# Patient Record
Sex: Male | Born: 1970 | Race: White | Hispanic: No | Marital: Single | Smoking: Never smoker
Health system: Southern US, Community
[De-identification: ages and names within clinical notes are randomized; demographics above are authoritative.]

## PROBLEM LIST (undated history)

## (undated) DIAGNOSIS — M1990 Unspecified osteoarthritis, unspecified site: Secondary | ICD-10-CM

## (undated) DIAGNOSIS — G4730 Sleep apnea, unspecified: Secondary | ICD-10-CM

## (undated) DIAGNOSIS — K429 Umbilical hernia without obstruction or gangrene: Secondary | ICD-10-CM

## (undated) DIAGNOSIS — R531 Weakness: Secondary | ICD-10-CM

## (undated) DIAGNOSIS — F329 Major depressive disorder, single episode, unspecified: Secondary | ICD-10-CM

## (undated) DIAGNOSIS — I10 Essential (primary) hypertension: Secondary | ICD-10-CM

## (undated) DIAGNOSIS — F32A Depression, unspecified: Secondary | ICD-10-CM

## (undated) DIAGNOSIS — M549 Dorsalgia, unspecified: Secondary | ICD-10-CM

## (undated) DIAGNOSIS — K760 Fatty (change of) liver, not elsewhere classified: Secondary | ICD-10-CM

## (undated) DIAGNOSIS — G8929 Other chronic pain: Secondary | ICD-10-CM

## (undated) DIAGNOSIS — K219 Gastro-esophageal reflux disease without esophagitis: Secondary | ICD-10-CM

## (undated) DIAGNOSIS — E785 Hyperlipidemia, unspecified: Secondary | ICD-10-CM

## (undated) DIAGNOSIS — M62838 Other muscle spasm: Secondary | ICD-10-CM

## (undated) HISTORY — PX: BACK SURGERY: SHX140

## (undated) HISTORY — DX: Fatty (change of) liver, not elsewhere classified: K76.0

## (undated) HISTORY — PX: APPENDECTOMY: SHX54

---

## 2014-03-09 ENCOUNTER — Other Ambulatory Visit: Payer: Self-pay | Admitting: Neurosurgery

## 2014-03-21 ENCOUNTER — Encounter (HOSPITAL_COMMUNITY): Payer: Self-pay | Admitting: Pharmacy Technician

## 2014-03-23 NOTE — Pre-Procedure Instructions (Signed)
Corey Chen  03/23/2014   Your procedure is scheduled on:  Thurs, Sept 10 @ 10:00 AM  Report to [facility]  at 7:00 AM.  Call this number if you have problems the morning of surgery: [phone]   Remember:   Do not eat food or drink liquids after midnight.   Take these medicines the morning of surgery with A SIP OF WATER: Pain Pill(if needed)               No Goody's,BC's,Aleve,Aspirin,Ibuprofen,Fish Oil,or any Herbal Medications   Do not wear jewelry  Do not wear lotions, powders, or colognes. You may wear deodorant.  Men may shave face and neck.  Do not bring valuables to the hospital.  [facility] is not responsible                  for any belongings or valuables.               Contacts, dentures or bridgework may not be worn into surgery.  Leave suitcase in the car. After surgery it may be brought to your room.  For patients admitted to the hospital, discharge time is determined by your                treatment team.                Special Instructions:  [facility] - Preparing for Surgery  Before surgery, you can play an important role.  Because skin is not sterile, your skin needs to be as free of germs as possible.  You can reduce the number of germs on you skin by washing with CHG (chlorahexidine gluconate) soap before surgery.  CHG is an antiseptic cleaner which kills germs and bonds with the skin to continue killing germs even after washing.  Please DO NOT use if you have an allergy to CHG or antibacterial soaps.  If your skin becomes reddened/irritated stop using the CHG and inform your nurse when you arrive at Short Stay.  Do not shave (including legs and underarms) for at least 48 hours prior to the first CHG shower.  You may shave your face.  Please follow these instructions carefully:   1.  Shower with CHG Soap the night before surgery and the                                morning of Surgery.  2.  If you choose to wash your hair, wash your hair first  as usual with your       normal shampoo.  3.  After you shampoo, rinse your hair and body thoroughly to remove the                      Shampoo.  4.  Use CHG as you would any other liquid soap.  You can apply chg directly       to the skin and wash gently with scrungie or a clean washcloth.  5.  Apply the CHG Soap to your body ONLY FROM THE NECK DOWN.        Do not use on open wounds or open sores.  Avoid contact with your eyes,       ears, mouth and genitals (private parts).  Wash genitals (private parts)       with your normal soap.  6.  Wash thoroughly, paying special attention  to the area where your surgery        will be performed.  7.  Thoroughly rinse your body with warm water from the neck down.  8.  DO NOT shower/wash with your normal soap after using and rinsing off       the CHG Soap.  9.  Pat yourself dry with a clean towel.            10.  Wear clean pajamas.            11.  Place clean sheets on your bed the night of your first shower and do not        sleep with pets.  Day of Surgery  Do not apply any lotions/deoderants the morning of surgery.  Please wear clean clothes to the hospital/surgery center.     Please read over the following fact sheets that you were given: Pain Booklet, Coughing and Deep Breathing, Blood Transfusion Information, MRSA Information and Surgical Site Infection Prevention

## 2014-03-24 ENCOUNTER — Encounter (HOSPITAL_COMMUNITY)
Admission: RE | Admit: 2014-03-24 | Discharge: 2014-03-24 | Disposition: A | Discharge status: Home / Self Care | Payer: Managed Care, Other (non HMO) | Source: Ambulatory Visit | Attending: Anesthesiology | Admitting: Anesthesiology

## 2014-03-24 ENCOUNTER — Encounter (HOSPITAL_COMMUNITY): Payer: Self-pay

## 2014-03-24 ENCOUNTER — Encounter (HOSPITAL_COMMUNITY)
Admission: RE | Admit: 2014-03-24 | Discharge: 2014-03-24 | Disposition: A | Discharge status: Home / Self Care | Payer: Managed Care, Other (non HMO) | Source: Ambulatory Visit | Attending: Neurosurgery | Admitting: Neurosurgery

## 2014-03-24 DIAGNOSIS — M5126 Other intervertebral disc displacement, lumbar region: Secondary | ICD-10-CM | POA: Diagnosis present

## 2014-03-24 DIAGNOSIS — Z01818 Encounter for other preprocedural examination: Secondary | ICD-10-CM | POA: Diagnosis present

## 2014-03-24 DIAGNOSIS — M47817 Spondylosis without myelopathy or radiculopathy, lumbosacral region: Secondary | ICD-10-CM | POA: Diagnosis present

## 2014-03-24 HISTORY — DX: Major depressive disorder, single episode, unspecified: F32.9

## 2014-03-24 HISTORY — DX: Depression, unspecified: F32.A

## 2014-03-24 HISTORY — DX: Dorsalgia, unspecified: M54.9

## 2014-03-24 HISTORY — DX: Other chronic pain: G89.29

## 2014-03-24 HISTORY — DX: Other muscle spasm: M62.838

## 2014-03-24 HISTORY — DX: Weakness: R53.1

## 2014-03-24 HISTORY — DX: Hyperlipidemia, unspecified: E78.5

## 2014-03-24 HISTORY — DX: Gastro-esophageal reflux disease without esophagitis: K21.9

## 2014-03-24 HISTORY — DX: Essential (primary) hypertension: I10

## 2014-03-24 HISTORY — DX: Unspecified osteoarthritis, unspecified site: M19.90

## 2014-03-24 HISTORY — DX: Umbilical hernia without obstruction or gangrene: K42.9

## 2014-03-24 HISTORY — DX: Sleep apnea, unspecified: G47.30

## 2014-03-24 LAB — CBC
HCT: 46.6 % (ref 39.0–52.0)
HEMOGLOBIN: 16.7 g/dL (ref 13.0–17.0)
MCH: 30.1 pg (ref 26.0–34.0)
MCHC: 35.8 g/dL (ref 30.0–36.0)
MCV: 84.1 fL (ref 78.0–100.0)
Platelets: 161 10*3/uL (ref 150–400)
RBC: 5.54 MIL/uL (ref 4.22–5.81)
RDW: 12.8 % (ref 11.5–15.5)
WBC: 5.2 10*3/uL (ref 4.0–10.5)

## 2014-03-24 LAB — SURGICAL PCR SCREEN
MRSA, PCR: NEGATIVE
Staphylococcus aureus: POSITIVE — AB

## 2014-03-24 LAB — BASIC METABOLIC PANEL
Anion gap: 15 (ref 5–15)
BUN: 15 mg/dL (ref 6–23)
CHLORIDE: 103 meq/L (ref 96–112)
CO2: 22 mEq/L (ref 19–32)
Calcium: 9.6 mg/dL (ref 8.4–10.5)
Creatinine, Ser: 0.84 mg/dL (ref 0.50–1.35)
GFR calc Af Amer: >90 mL/min (ref >90)
GFR calc non Af Amer: >90 mL/min (ref >90)
Glucose, Bld: 104 mg/dL — ABNORMAL HIGH (ref 70–99)
POTASSIUM: 4.1 meq/L (ref 3.7–5.3)
SODIUM: 140 meq/L (ref 137–147)

## 2014-03-24 LAB — TYPE AND SCREEN
ABO/RH(D): B POS
ANTIBODY SCREEN: NEGATIVE

## 2014-03-24 LAB — ABO/RH: ABO/RH(D): B POS

## 2014-03-24 NOTE — Progress Notes (Signed)
Pt doesn't have a cardiologist  Stress test done 5-19yrs ago  Echo done about 5-6 yrs ago  Denies ever having a heart cath  Denies EKG or CXR in past yr  Medical Md is Dr.David Hungerlan

## 2014-03-24 NOTE — Progress Notes (Signed)
Mupirocin called into [facility] on [address] in [place]

## 2014-03-30 MED ORDER — CEFAZOLIN SODIUM-DEXTROSE 2-3 GM-% IV SOLR
2.0000 g | INTRAVENOUS | Status: AC
  Administered 2014-03-31: 2 g via INTRAVENOUS
  Filled 2014-03-30: qty 50

## 2014-03-30 NOTE — Anesthesia Preprocedure Evaluation (Addendum)
Anesthesia Evaluation  Patient identified by MRN, date of birth, ID band Patient awake and Patient confused    Reviewed: Allergy & Precautions, H&P , NPO status , Patient's Chart, lab work & pertinent test results  Airway Mallampati: II TM Distance: >3 FB Neck ROM: Full    Dental  (+) Dental Advisory Given, Teeth Intact   Pulmonary sleep apnea ,  breath sounds clear to auscultation        Cardiovascular hypertension, Pt. on medications Rhythm:Regular Rate:Normal     Neuro/Psych PSYCHIATRIC DISORDERS Depression    GI/Hepatic GERD-  Medicated,  Endo/Other  Morbid obesity  Renal/GU      Musculoskeletal  (+) Arthritis -,   Abdominal (+) + obese,   Peds  Hematology   Anesthesia Other Findings   Reproductive/Obstetrics                        Anesthesia Physical Anesthesia Plan  ASA: III  Anesthesia Plan: General   Post-op Pain Management:    Induction: Intravenous  Airway Management Planned: Oral ETT  Additional Equipment:   Intra-op Plan:   Post-operative Plan: Extubation in OR  Informed Consent: I have reviewed the patients History and Physical, chart, labs and discussed the procedure including the risks, benefits and alternatives for the proposed anesthesia with the patient or authorized representative who has indicated his/her understanding and acceptance.   Dental advisory given  Plan Discussed with: CRNA, Anesthesiologist and Surgeon  Anesthesia Plan Comments:        Anesthesia Quick Evaluation

## 2014-03-31 ENCOUNTER — Inpatient Hospital Stay (HOSPITAL_COMMUNITY)
Admission: RE | Admit: 2014-03-31 | Discharge: 2014-04-02 | DRG: 460 | Disposition: A | Discharge status: Home / Self Care | Payer: Managed Care, Other (non HMO) | Source: Ambulatory Visit | Attending: Neurosurgery | Admitting: Neurosurgery

## 2014-03-31 ENCOUNTER — Encounter (HOSPITAL_COMMUNITY): Payer: Self-pay | Admitting: Certified Registered Nurse Anesthetist

## 2014-03-31 ENCOUNTER — Inpatient Hospital Stay (HOSPITAL_COMMUNITY): Payer: Managed Care, Other (non HMO) | Admitting: Anesthesiology

## 2014-03-31 ENCOUNTER — Encounter (HOSPITAL_COMMUNITY): Payer: Managed Care, Other (non HMO) | Admitting: Anesthesiology

## 2014-03-31 ENCOUNTER — Encounter (HOSPITAL_COMMUNITY)
Admission: RE | Disposition: A | Discharge status: Home / Self Care | Payer: Managed Care, Other (non HMO) | Source: Ambulatory Visit | Attending: Neurosurgery

## 2014-03-31 ENCOUNTER — Inpatient Hospital Stay (HOSPITAL_COMMUNITY): Payer: Managed Care, Other (non HMO)

## 2014-03-31 DIAGNOSIS — M5126 Other intervertebral disc displacement, lumbar region: Secondary | ICD-10-CM | POA: Diagnosis present

## 2014-03-31 DIAGNOSIS — K429 Umbilical hernia without obstruction or gangrene: Secondary | ICD-10-CM | POA: Diagnosis present

## 2014-03-31 DIAGNOSIS — Z6834 Body mass index (BMI) 34.0-34.9, adult: Secondary | ICD-10-CM

## 2014-03-31 DIAGNOSIS — Z8249 Family history of ischemic heart disease and other diseases of the circulatory system: Secondary | ICD-10-CM | POA: Diagnosis not present

## 2014-03-31 DIAGNOSIS — Z886 Allergy status to analgesic agent status: Secondary | ICD-10-CM | POA: Diagnosis not present

## 2014-03-31 DIAGNOSIS — I10 Essential (primary) hypertension: Secondary | ICD-10-CM | POA: Diagnosis present

## 2014-03-31 DIAGNOSIS — E7800 Pure hypercholesterolemia, unspecified: Secondary | ICD-10-CM | POA: Diagnosis present

## 2014-03-31 DIAGNOSIS — Z833 Family history of diabetes mellitus: Secondary | ICD-10-CM

## 2014-03-31 DIAGNOSIS — M5137 Other intervertebral disc degeneration, lumbosacral region: Secondary | ICD-10-CM | POA: Diagnosis present

## 2014-03-31 DIAGNOSIS — E669 Obesity, unspecified: Secondary | ICD-10-CM | POA: Diagnosis present

## 2014-03-31 DIAGNOSIS — K219 Gastro-esophageal reflux disease without esophagitis: Secondary | ICD-10-CM | POA: Diagnosis present

## 2014-03-31 DIAGNOSIS — Z79899 Other long term (current) drug therapy: Secondary | ICD-10-CM | POA: Diagnosis not present

## 2014-03-31 DIAGNOSIS — M4726 Other spondylosis with radiculopathy, lumbar region: Secondary | ICD-10-CM

## 2014-03-31 DIAGNOSIS — G8918 Other acute postprocedural pain: Secondary | ICD-10-CM | POA: Diagnosis not present

## 2014-03-31 DIAGNOSIS — M47817 Spondylosis without myelopathy or radiculopathy, lumbosacral region: Secondary | ICD-10-CM | POA: Diagnosis present

## 2014-03-31 DIAGNOSIS — M4120 Other idiopathic scoliosis, site unspecified: Secondary | ICD-10-CM | POA: Diagnosis present

## 2014-03-31 DIAGNOSIS — M51379 Other intervertebral disc degeneration, lumbosacral region without mention of lumbar back pain or lower extremity pain: Secondary | ICD-10-CM | POA: Diagnosis present

## 2014-03-31 DIAGNOSIS — M47816 Spondylosis without myelopathy or radiculopathy, lumbar region: Secondary | ICD-10-CM | POA: Diagnosis present

## 2014-03-31 DIAGNOSIS — M545 Low back pain, unspecified: Secondary | ICD-10-CM | POA: Diagnosis present

## 2014-03-31 HISTORY — PX: MAXIMUM ACCESS (MAS)POSTERIOR LUMBAR INTERBODY FUSION (PLIF) 2 LEVEL: SHX6369

## 2014-03-31 SURGERY — FOR MAXIMUM ACCESS (MAS) POSTERIOR LUMBAR INTERBODY FUSION (PLIF) 2 LEVEL
Anesthesia: General | Site: Back

## 2014-03-31 MED ORDER — HYDROMORPHONE HCL PF 1 MG/ML IJ SOLN
INTRAMUSCULAR | Status: AC
Start: 2014-03-31 — End: 2014-03-31
  Administered 2014-03-31: 0.5 mg
  Filled 2014-03-31: qty 1

## 2014-03-31 MED ORDER — ONDANSETRON HCL 4 MG/2ML IJ SOLN
INTRAMUSCULAR | Status: AC
  Filled 2014-03-31: qty 2

## 2014-03-31 MED ORDER — SODIUM CHLORIDE 0.9 % IJ SOLN: 3.0000 mL | INTRAMUSCULAR | Status: DC | PRN

## 2014-03-31 MED ORDER — PANTOPRAZOLE SODIUM 40 MG PO TBEC
40.0000 mg | DELAYED_RELEASE_TABLET | Freq: Every day | ORAL | Status: DC
  Administered 2014-03-31 – 2014-04-01 (×2): 40 mg via ORAL
  Filled 2014-03-31 (×2): qty 1

## 2014-03-31 MED ORDER — ROCURONIUM BROMIDE 50 MG/5ML IV SOLN
INTRAVENOUS | Status: AC
  Filled 2014-03-31: qty 1

## 2014-03-31 MED ORDER — PROPOFOL 10 MG/ML IV BOLUS
INTRAVENOUS | Status: DC | PRN
  Administered 2014-03-31: 200 mg via INTRAVENOUS
  Administered 2014-03-31: 30 mg via INTRAVENOUS

## 2014-03-31 MED ORDER — BUPIVACAINE LIPOSOME 1.3 % IJ SUSP
INTRAMUSCULAR | Status: DC | PRN
  Administered 2014-03-31: 20 mL

## 2014-03-31 MED ORDER — FENTANYL CITRATE 0.05 MG/ML IJ SOLN
INTRAMUSCULAR | Status: AC
  Filled 2014-03-31: qty 5

## 2014-03-31 MED ORDER — DIAZEPAM 5 MG PO TABS
5.0000 mg | ORAL_TABLET | Freq: Four times a day (QID) | ORAL | Status: DC | PRN
  Administered 2014-04-01: 5 mg via ORAL
  Filled 2014-03-31: qty 1

## 2014-03-31 MED ORDER — ALBUMIN HUMAN 5 % IV SOLN
INTRAVENOUS | Status: DC | PRN
  Administered 2014-03-31: 11:00:00 via INTRAVENOUS

## 2014-03-31 MED ORDER — OLMESARTAN MEDOXOMIL-HCTZ 40-25 MG PO TABS: 1.0000 | ORAL_TABLET | Freq: Every day | ORAL | Status: DC

## 2014-03-31 MED ORDER — LIDOCAINE-EPINEPHRINE 1 %-1:100000 IJ SOLN
INTRAMUSCULAR | Status: DC | PRN
  Administered 2014-03-31: 5 mL

## 2014-03-31 MED ORDER — LACTATED RINGERS IV SOLN
INTRAVENOUS | Status: DC
  Administered 2014-03-31: 07:00:00 via INTRAVENOUS

## 2014-03-31 MED ORDER — LACTATED RINGERS IV SOLN
INTRAVENOUS | Status: DC | PRN
  Administered 2014-03-31 (×2): via INTRAVENOUS

## 2014-03-31 MED ORDER — PHENOL 1.4 % MT LIQD: 1.0000 | OROMUCOSAL | Status: DC | PRN

## 2014-03-31 MED ORDER — FENTANYL CITRATE 0.05 MG/ML IJ SOLN
INTRAMUSCULAR | Status: AC
  Filled 2014-03-31: qty 2

## 2014-03-31 MED ORDER — PROMETHAZINE HCL 25 MG/ML IJ SOLN: 6.2500 mg | INTRAMUSCULAR | Status: DC | PRN

## 2014-03-31 MED ORDER — ARTIFICIAL TEARS OP OINT
TOPICAL_OINTMENT | OPHTHALMIC | Status: DC | PRN
  Administered 2014-03-31: 1 via OPHTHALMIC

## 2014-03-31 MED ORDER — ACETAMINOPHEN 10 MG/ML IV SOLN: 1000.0000 mg | Freq: Once | INTRAVENOUS | Status: DC

## 2014-03-31 MED ORDER — LIDOCAINE HCL (CARDIAC) 20 MG/ML IV SOLN
INTRAVENOUS | Status: AC
  Filled 2014-03-31: qty 5

## 2014-03-31 MED ORDER — HYDROCODONE-ACETAMINOPHEN 5-325 MG PO TABS: 1.0000 | ORAL_TABLET | ORAL | Status: DC | PRN

## 2014-03-31 MED ORDER — FLEET ENEMA 7-19 GM/118ML RE ENEM
1.0000 | ENEMA | Freq: Once | RECTAL | Status: AC | PRN
  Filled 2014-03-31: qty 1

## 2014-03-31 MED ORDER — FENTANYL CITRATE 0.05 MG/ML IJ SOLN
25.0000 ug | INTRAMUSCULAR | Status: DC | PRN
  Administered 2014-03-31 (×3): 50 ug via INTRAVENOUS

## 2014-03-31 MED ORDER — MENTHOL 3 MG MT LOZG: 1.0000 | LOZENGE | OROMUCOSAL | Status: DC | PRN

## 2014-03-31 MED ORDER — ARTIFICIAL TEARS OP OINT
TOPICAL_OINTMENT | OPHTHALMIC | Status: AC
Start: 2014-03-31 — End: 2014-03-31
  Filled 2014-03-31: qty 3.5

## 2014-03-31 MED ORDER — EPHEDRINE SULFATE 50 MG/ML IJ SOLN
INTRAMUSCULAR | Status: AC
  Filled 2014-03-31: qty 1

## 2014-03-31 MED ORDER — FENTANYL CITRATE 0.05 MG/ML IJ SOLN
INTRAMUSCULAR | Status: DC | PRN
  Administered 2014-03-31: 25 ug via INTRAVENOUS
  Administered 2014-03-31: 50 ug via INTRAVENOUS
  Administered 2014-03-31: 25 ug via INTRAVENOUS
  Administered 2014-03-31: 50 ug via INTRAVENOUS
  Administered 2014-03-31: 25 ug via INTRAVENOUS
  Administered 2014-03-31: 100 ug via INTRAVENOUS
  Administered 2014-03-31: 50 ug via INTRAVENOUS
  Administered 2014-03-31: 150 ug via INTRAVENOUS
  Administered 2014-03-31: 25 ug via INTRAVENOUS

## 2014-03-31 MED ORDER — DOCUSATE SODIUM 100 MG PO CAPS
100.0000 mg | ORAL_CAPSULE | Freq: Two times a day (BID) | ORAL | Status: DC
  Administered 2014-03-31 – 2014-04-01 (×3): 100 mg via ORAL
  Filled 2014-03-31 (×5): qty 1

## 2014-03-31 MED ORDER — PROPOFOL 10 MG/ML IV BOLUS
INTRAVENOUS | Status: AC
  Filled 2014-03-31: qty 20

## 2014-03-31 MED ORDER — ACETAMINOPHEN 650 MG RE SUPP: 650.0000 mg | RECTAL | Status: DC | PRN

## 2014-03-31 MED ORDER — SODIUM CHLORIDE 0.9 % IJ SOLN
3.0000 mL | Freq: Two times a day (BID) | INTRAMUSCULAR | Status: DC
  Administered 2014-03-31 – 2014-04-01 (×3): 3 mL via INTRAVENOUS

## 2014-03-31 MED ORDER — MUPIROCIN 2 % EX OINT: TOPICAL_OINTMENT | Freq: Two times a day (BID) | CUTANEOUS | Status: AC

## 2014-03-31 MED ORDER — CEFAZOLIN SODIUM 1-5 GM-% IV SOLN
1.0000 g | Freq: Three times a day (TID) | INTRAVENOUS | Status: AC
  Administered 2014-03-31 – 2014-04-01 (×2): 1 g via INTRAVENOUS
  Filled 2014-03-31 (×3): qty 50

## 2014-03-31 MED ORDER — ONDANSETRON HCL 4 MG/2ML IJ SOLN
INTRAMUSCULAR | Status: DC | PRN
  Administered 2014-03-31: 4 mg via INTRAVENOUS

## 2014-03-31 MED ORDER — IRBESARTAN 300 MG PO TABS
300.0000 mg | ORAL_TABLET | Freq: Every day | ORAL | Status: DC
  Administered 2014-03-31 – 2014-04-01 (×2): 300 mg via ORAL
  Filled 2014-03-31 (×3): qty 1

## 2014-03-31 MED ORDER — DM-GUAIFENESIN ER 30-600 MG PO TB12
2.0000 | ORAL_TABLET | Freq: Every day | ORAL | Status: DC
  Administered 2014-04-01: 2 via ORAL
  Filled 2014-03-31 (×2): qty 2

## 2014-03-31 MED ORDER — ALUM & MAG HYDROXIDE-SIMETH 200-200-20 MG/5ML PO SUSP: 30.0000 mL | Freq: Four times a day (QID) | ORAL | Status: DC | PRN

## 2014-03-31 MED ORDER — ONDANSETRON HCL 4 MG/2ML IJ SOLN
4.0000 mg | INTRAMUSCULAR | Status: DC | PRN
  Administered 2014-03-31: 4 mg via INTRAVENOUS
  Filled 2014-03-31: qty 2

## 2014-03-31 MED ORDER — ZOLPIDEM TARTRATE 5 MG PO TABS: 5.0000 mg | ORAL_TABLET | Freq: Every evening | ORAL | Status: DC | PRN

## 2014-03-31 MED ORDER — SENNA 8.6 MG PO TABS
1.0000 | ORAL_TABLET | Freq: Two times a day (BID) | ORAL | Status: DC
  Administered 2014-03-31 – 2014-04-01 (×3): 8.6 mg via ORAL
  Filled 2014-03-31 (×5): qty 1

## 2014-03-31 MED ORDER — SUCCINYLCHOLINE CHLORIDE 20 MG/ML IJ SOLN
INTRAMUSCULAR | Status: DC | PRN
  Administered 2014-03-31: 100 mg via INTRAVENOUS

## 2014-03-31 MED ORDER — MORPHINE SULFATE 2 MG/ML IJ SOLN: 1.0000 mg | INTRAMUSCULAR | Status: DC | PRN

## 2014-03-31 MED ORDER — MIDAZOLAM HCL 5 MG/5ML IJ SOLN
INTRAMUSCULAR | Status: DC | PRN
  Administered 2014-03-31: 2 mg via INTRAVENOUS

## 2014-03-31 MED ORDER — SUCCINYLCHOLINE CHLORIDE 20 MG/ML IJ SOLN
INTRAMUSCULAR | Status: AC
Start: 2014-03-31 — End: 2014-03-31
  Filled 2014-03-31: qty 1

## 2014-03-31 MED ORDER — 0.9 % SODIUM CHLORIDE (POUR BTL) OPTIME
TOPICAL | Status: DC | PRN
  Administered 2014-03-31: 1000 mL

## 2014-03-31 MED ORDER — THROMBIN 20000 UNITS EX SOLR
CUTANEOUS | Status: DC | PRN
  Administered 2014-03-31: 11:00:00 via TOPICAL

## 2014-03-31 MED ORDER — ACETAMINOPHEN 10 MG/ML IV SOLN
INTRAVENOUS | Status: AC
  Administered 2014-03-31: 1000 mg
  Filled 2014-03-31: qty 100

## 2014-03-31 MED ORDER — POLYETHYLENE GLYCOL 3350 17 G PO PACK
17.0000 g | PACK | Freq: Every day | ORAL | Status: DC | PRN
  Filled 2014-03-31: qty 1

## 2014-03-31 MED ORDER — BUPIVACAINE HCL (PF) 0.5 % IJ SOLN
INTRAMUSCULAR | Status: DC | PRN
  Administered 2014-03-31: 5 mL

## 2014-03-31 MED ORDER — DIAZEPAM 5 MG PO TABS
ORAL_TABLET | ORAL | Status: AC
Start: 2014-03-31 — End: 2014-03-31
  Administered 2014-03-31: 5 mg
  Filled 2014-03-31: qty 1

## 2014-03-31 MED ORDER — HYDROMORPHONE HCL PF 1 MG/ML IJ SOLN
0.5000 mg | Freq: Once | INTRAMUSCULAR | Status: AC
  Administered 2014-03-31: 0.5 mg via INTRAVENOUS

## 2014-03-31 MED ORDER — ACETAMINOPHEN 325 MG PO TABS: 650.0000 mg | ORAL_TABLET | ORAL | Status: DC | PRN

## 2014-03-31 MED ORDER — EPHEDRINE SULFATE 50 MG/ML IJ SOLN
INTRAMUSCULAR | Status: DC | PRN
  Administered 2014-03-31 (×3): 5 mg via INTRAVENOUS

## 2014-03-31 MED ORDER — HYDROMORPHONE HCL PF 1 MG/ML IJ SOLN
0.5000 mg | INTRAMUSCULAR | Status: DC | PRN
  Administered 2014-03-31 – 2014-04-02 (×6): 1 mg via INTRAVENOUS
  Filled 2014-03-31 (×6): qty 1

## 2014-03-31 MED ORDER — MUPIROCIN 2 % EX OINT: TOPICAL_OINTMENT | Freq: Two times a day (BID) | CUTANEOUS | Status: DC

## 2014-03-31 MED ORDER — HYDROCODONE-ACETAMINOPHEN 10-325 MG PO TABS: 1.0000 | ORAL_TABLET | Freq: Four times a day (QID) | ORAL | Status: DC | PRN

## 2014-03-31 MED ORDER — MIDAZOLAM HCL 2 MG/2ML IJ SOLN
INTRAMUSCULAR | Status: AC
  Filled 2014-03-31: qty 2

## 2014-03-31 MED ORDER — HYDROCHLOROTHIAZIDE 25 MG PO TABS
25.0000 mg | ORAL_TABLET | Freq: Every day | ORAL | Status: DC
  Administered 2014-03-31 – 2014-04-01 (×2): 25 mg via ORAL
  Filled 2014-03-31 (×3): qty 1

## 2014-03-31 MED ORDER — LIDOCAINE HCL (CARDIAC) 20 MG/ML IV SOLN
INTRAVENOUS | Status: DC | PRN
  Administered 2014-03-31: 100 mg via INTRAVENOUS

## 2014-03-31 MED ORDER — BISACODYL 10 MG RE SUPP: 10.0000 mg | Freq: Every day | RECTAL | Status: DC | PRN

## 2014-03-31 MED ORDER — STERILE WATER FOR INJECTION IJ SOLN
INTRAMUSCULAR | Status: AC
  Filled 2014-03-31: qty 10

## 2014-03-31 MED ORDER — LACTATED RINGERS IV SOLN
INTRAVENOUS | Status: DC | PRN
  Administered 2014-03-31 (×2): via INTRAVENOUS

## 2014-03-31 MED ORDER — BUPIVACAINE LIPOSOME 1.3 % IJ SUSP
20.0000 mL | INTRAMUSCULAR | Status: DC
  Filled 2014-03-31: qty 20

## 2014-03-31 MED ORDER — TIZANIDINE HCL 4 MG PO TABS
4.0000 mg | ORAL_TABLET | Freq: Every day | ORAL | Status: DC
  Administered 2014-03-31: 4 mg via ORAL
  Filled 2014-03-31 (×2): qty 1

## 2014-03-31 MED ORDER — MEPERIDINE HCL 25 MG/ML IJ SOLN: 6.2500 mg | INTRAMUSCULAR | Status: DC | PRN

## 2014-03-31 MED ORDER — HYDROMORPHONE HCL 2 MG PO TABS
2.0000 mg | ORAL_TABLET | ORAL | Status: DC | PRN
  Administered 2014-03-31 – 2014-04-01 (×3): 4 mg via ORAL
  Filled 2014-03-31 (×3): qty 2

## 2014-03-31 MED ORDER — KCL IN DEXTROSE-NACL 20-5-0.45 MEQ/L-%-% IV SOLN
INTRAVENOUS | Status: DC
  Filled 2014-03-31 (×5): qty 1000

## 2014-03-31 SURGICAL SUPPLY — 98 items
BAG DECANTER FOR FLEXI CONT (MISCELLANEOUS) IMPLANT
BENZOIN TINCTURE PRP APPL 2/3 (GAUZE/BANDAGES/DRESSINGS) ×2 IMPLANT
BLADE SURG ROTATE 9660 (MISCELLANEOUS) ×2 IMPLANT
BONE MATRIX OSTEOCEL PRO MED (Bone Implant) ×4 IMPLANT
BUR MATCHSTICK NEURO 3.0 LAGG (BURR) ×2 IMPLANT
BUR PRECISION FLUTE 5.0 (BURR) ×4 IMPLANT
CAGE COROENT LG 10X9X23-12 (Cage) ×4 IMPLANT
CAGE COROENT LG 12X9X23-12 (Cage) ×4 IMPLANT
CANISTER SUCT 3000ML (MISCELLANEOUS) ×2 IMPLANT
CLIP NEUROVISION LG (CLIP) ×2 IMPLANT
CONT SPEC 4OZ CLIKSEAL STRL BL (MISCELLANEOUS) ×4 IMPLANT
COVER BACK TABLE 24X17X13 BIG (DRAPES) IMPLANT
COVER TABLE BACK 60X90 (DRAPES) ×2 IMPLANT
DERMABOND ADHESIVE PROPEN (GAUZE/BANDAGES/DRESSINGS) ×2
DERMABOND ADVANCED (GAUZE/BANDAGES/DRESSINGS)
DERMABOND ADVANCED .7 DNX12 (GAUZE/BANDAGES/DRESSINGS) IMPLANT
DERMABOND ADVANCED .7 DNX6 (GAUZE/BANDAGES/DRESSINGS) ×2 IMPLANT
DRAPE C-ARM 42X72 X-RAY (DRAPES) ×2 IMPLANT
DRAPE C-ARMOR (DRAPES) ×2 IMPLANT
DRAPE LAPAROTOMY 100X72X124 (DRAPES) ×2 IMPLANT
DRAPE POUCH INSTRU U-SHP 10X18 (DRAPES) ×2 IMPLANT
DRAPE SURG 17X23 STRL (DRAPES) ×2 IMPLANT
DRSG OPSITE POSTOP 4X6 (GAUZE/BANDAGES/DRESSINGS) ×2 IMPLANT
DRSG OPSITE POSTOP 4X8 (GAUZE/BANDAGES/DRESSINGS) ×2 IMPLANT
DRSG TELFA 3X8 NADH (GAUZE/BANDAGES/DRESSINGS) IMPLANT
DURAPREP 26ML APPLICATOR (WOUND CARE) ×2 IMPLANT
ELECT BLADE 4.0 EZ CLEAN MEGAD (MISCELLANEOUS) ×2
ELECT REM PT RETURN 9FT ADLT (ELECTROSURGICAL) ×2
ELECTRODE BLDE 4.0 EZ CLN MEGD (MISCELLANEOUS) ×1 IMPLANT
ELECTRODE REM PT RTRN 9FT ADLT (ELECTROSURGICAL) ×1 IMPLANT
EVACUATOR 1/8 PVC DRAIN (DRAIN) IMPLANT
GAUZE SPONGE 4X4 12PLY STRL (GAUZE/BANDAGES/DRESSINGS) ×2 IMPLANT
GAUZE SPONGE 4X4 16PLY XRAY LF (GAUZE/BANDAGES/DRESSINGS) IMPLANT
GLOVE BIO SURGEON STRL SZ8 (GLOVE) ×2 IMPLANT
GLOVE BIO SURGEON STRL SZ8.5 (GLOVE) ×2 IMPLANT
GLOVE BIOGEL PI IND STRL 7.0 (GLOVE) ×3 IMPLANT
GLOVE BIOGEL PI IND STRL 7.5 (GLOVE) ×1 IMPLANT
GLOVE BIOGEL PI IND STRL 8 (GLOVE) ×1 IMPLANT
GLOVE BIOGEL PI IND STRL 8.5 (GLOVE) ×1 IMPLANT
GLOVE BIOGEL PI INDICATOR 7.0 (GLOVE) ×3
GLOVE BIOGEL PI INDICATOR 7.5 (GLOVE) ×1
GLOVE BIOGEL PI INDICATOR 8 (GLOVE) ×1
GLOVE BIOGEL PI INDICATOR 8.5 (GLOVE) ×1
GLOVE ECLIPSE 7.5 STRL STRAW (GLOVE) ×2 IMPLANT
GLOVE ECLIPSE 8.0 STRL XLNG CF (GLOVE) ×2 IMPLANT
GLOVE EXAM NITRILE LRG STRL (GLOVE) IMPLANT
GLOVE EXAM NITRILE MD LF STRL (GLOVE) IMPLANT
GLOVE EXAM NITRILE XL STR (GLOVE) IMPLANT
GLOVE EXAM NITRILE XS STR PU (GLOVE) IMPLANT
GLOVE SS BIOGEL STRL SZ 8 (GLOVE) ×1 IMPLANT
GLOVE SUPERSENSE BIOGEL SZ 8 (GLOVE) ×1
GLOVE SURG SS PI 7.0 STRL IVOR (GLOVE) ×8 IMPLANT
GOWN STRL REUS W/ TWL LRG LVL3 (GOWN DISPOSABLE) ×3 IMPLANT
GOWN STRL REUS W/ TWL XL LVL3 (GOWN DISPOSABLE) ×3 IMPLANT
GOWN STRL REUS W/TWL 2XL LVL3 (GOWN DISPOSABLE) ×2 IMPLANT
GOWN STRL REUS W/TWL LRG LVL3 (GOWN DISPOSABLE) ×3
GOWN STRL REUS W/TWL XL LVL3 (GOWN DISPOSABLE) ×3
KIT BASIN OR (CUSTOM PROCEDURE TRAY) ×2 IMPLANT
KIT NEEDLE NVM5 EMG ELECT (KITS) ×1 IMPLANT
KIT NEEDLE NVM5 EMG ELECTRODE (KITS) ×1
KIT POSITION SURG JACKSON T1 (MISCELLANEOUS) ×2 IMPLANT
KIT ROOM TURNOVER OR (KITS) ×2 IMPLANT
MILL MEDIUM DISP (BLADE) ×2 IMPLANT
NEEDLE HYPO 21X1.5 SAFETY (NEEDLE) ×2 IMPLANT
NEEDLE HYPO 25X1 1.5 SAFETY (NEEDLE) ×2 IMPLANT
NEEDLE SPNL 18GX3.5 QUINCKE PK (NEEDLE) ×2 IMPLANT
NS IRRIG 1000ML POUR BTL (IV SOLUTION) ×2 IMPLANT
PACK LAMINECTOMY NEURO (CUSTOM PROCEDURE TRAY) ×2 IMPLANT
PAD ARMBOARD 7.5X6 YLW CONV (MISCELLANEOUS) ×6 IMPLANT
PATTIES SURGICAL .5 X.5 (GAUZE/BANDAGES/DRESSINGS) IMPLANT
PATTIES SURGICAL .5 X1 (DISPOSABLE) IMPLANT
PATTIES SURGICAL 1X1 (DISPOSABLE) IMPLANT
ROD 55MM (Rod) ×2 IMPLANT
ROD SPNL 55XPREBNT NS MAS (Rod) ×2 IMPLANT
SCREW LOCK (Screw) ×6 IMPLANT
SCREW LOCK FXNS SPNE MAS PL (Screw) ×6 IMPLANT
SCREW PLIF MAS 5.5X35 LUMBAR (Screw) ×4 IMPLANT
SCREW SHANK 6.5X65 (Screw) ×4 IMPLANT
SCREW SHANKS 5.5X35 (Screw) ×4 IMPLANT
SCREW TULIP 5.5 (Screw) ×8 IMPLANT
SPONGE LAP 4X18 X RAY DECT (DISPOSABLE) IMPLANT
SPONGE SURGIFOAM ABS GEL 100 (HEMOSTASIS) ×2 IMPLANT
STAPLER SKIN PROX WIDE 3.9 (STAPLE) ×2 IMPLANT
STRIP CLOSURE SKIN 1/2X4 (GAUZE/BANDAGES/DRESSINGS) ×2 IMPLANT
SUT VIC AB 1 CT1 18XBRD ANBCTR (SUTURE) ×2 IMPLANT
SUT VIC AB 1 CT1 8-18 (SUTURE) ×2
SUT VIC AB 2-0 CT1 18 (SUTURE) ×4 IMPLANT
SUT VIC AB 3-0 SH 8-18 (SUTURE) ×4 IMPLANT
SYR 20CC LL (SYRINGE) ×2 IMPLANT
SYR 20ML ECCENTRIC (SYRINGE) ×2 IMPLANT
SYR 3ML LL SCALE MARK (SYRINGE) IMPLANT
SYR 5ML LL (SYRINGE) IMPLANT
TOWEL OR 17X24 6PK STRL BLUE (TOWEL DISPOSABLE) ×2 IMPLANT
TOWEL OR 17X26 10 PK STRL BLUE (TOWEL DISPOSABLE) ×2 IMPLANT
TRAP SPECIMEN MUCOUS 40CC (MISCELLANEOUS) ×2 IMPLANT
TRAY FOLEY CATH 14FRSI W/METER (CATHETERS) IMPLANT
TRAY FOLEY CATH 16FRSI W/METER (SET/KITS/TRAYS/PACK) ×2 IMPLANT
WATER STERILE IRR 1000ML POUR (IV SOLUTION) ×2 IMPLANT

## 2014-03-31 NOTE — Anesthesia Procedure Notes (Signed)
Procedure Name: Intubation Date/Time: 03/31/2014 10:14 AM Performed by: Vita Barley E Pre-anesthesia Checklist: Patient identified, Emergency Drugs available, Suction available and Patient being monitored Patient Re-evaluated:Patient Re-evaluated prior to inductionOxygen Delivery Method: Circle system utilized Preoxygenation: Pre-oxygenation with 100% oxygen Intubation Type: IV induction Ventilation: Mask ventilation without difficulty and Two handed mask ventilation required Laryngoscope Size: Miller and 2 Grade View: Grade I Tube type: Oral Tube size: 7.5 mm Number of attempts: 1 Airway Equipment and Method: Stylet Placement Confirmation: ETT inserted through vocal cords under direct vision,  positive ETCO2 and breath sounds checked- equal and bilateral Secured at: 23 cm Tube secured with: Tape Dental Injury: Teeth and Oropharynx as per pre-operative assessment

## 2014-03-31 NOTE — Transfer of Care (Signed)
Immediate Anesthesia Transfer of Care Note  Patient: Corey Chen  Procedure(s) Performed: Procedure(s) with comments: Lumbar four-five, Lumbar five-Sacral one Redo Laminectomy with maximum access posterior lumbar interbody fusion (N/A) - Lumbar four-five, Lumbar five-Sacral one Redo Laminectomy with maximum access posterior lumbar interbody fusion  Patient Location: PACU  Anesthesia Type:General  Level of Consciousness: awake, alert  and oriented  Airway & Oxygen Therapy: Patient Spontanous Breathing and Patient connected to nasal cannula oxygen  Post-op Assessment: Report given to PACU RN, Post -op Vital signs reviewed and stable and Patient moving all extremities X 4  Post vital signs: Reviewed and stable  Complications: No apparent anesthesia complications

## 2014-03-31 NOTE — H&P (Signed)
> [address] [place], [place] [address] Phone: [phone]   Patient ID:   [id number] Patient: Corey Chen  Date of Birth: 11-18-1970 Visit Type: Office Visit   Date: 03/09/2014 08:45 AM Provider: Danae Orleans. Venetia Maxon MD   This 43 year old male presents for Follow Up of back pain.  History of Present Illness: 1.  Follow Up of back pain  Pt returns to review MRI.  MRI & X-ray on [facility]  The patient returns today to review his MRI of his lumbar spine which demonstrates retrolisthesis of L4 and L5 and L5 on S1 with severe foraminal and extraforaminal stenosis at both of these levels.  He is already had laminectomy and there is significant nerve root compression which extends into and beyond the foramen.  The combination of disc height loss, disc protrusion and retrolisthesis is causing significant nerve root compression.  I do not believe it'll be possible to decompress his nerve roots adequately and relieve his pain without decompressing the nerve roots as they extend out the neural foramen E, this will necessarily involve disarticulating the facet joint in order to gain access to the foramen and expand course for the nerve root out of the spine.  For this reason he will require fusion surgery at both the L4 L5 and L5-S1 levels.  He is artery had a simple decompression and is not achieved relief with that surgery.  I discussed treatment options for the patient and do not believe there is another surgical option.  He does not feel he can continue to live with degree of pain he is experiencing now and wants to go ahead with surgery.  Nursing education was performed.  We went over models.  We went over his radiographs.  We fitted him for an [employer] quick draw LSO brace.  Neurologic examination remained stable.  He continues to have left greater than right lower extremity pain and he has weakness in an L5 distribution.  The L5 nerve root is compressed both at the  L4 L5 level as well as in the L5-S1 foraminal and extraforaminal space.      Medical/Surgical/Interim History Reviewed, no change.  Last detailed document date:02/14/2014.   Family History: Reviewed, no changes.  Last detailed document: 02/14/2014.   Social History: Tobacco use reviewed. Reviewed, no changes. Last detailed document date: 02/14/2014.      MEDICATIONS(added, continued or stopped this visit):   Started Medication Directions Instruction Stopped   Benicar HCT 40 mg-25 mg tablet take 1 tablet by oral route  every day     ibuprofen 800 mg tablet take 1 tablet by oral route 3 times every day as needed     Lexapro 10 mg tablet take 1 tablet by oral route  every day    03/09/2014 Lortab 10 mg-325 mg tablet take 1 tablet by oral route  every 6 hours as needed for pain     Lortab 10 mg-325 mg tablet take 1 tablet by oral route  every 8 hours as needed for pain  03/09/2014   Nexium 40 mg capsule,delayed release take 1 capsule by oral route  every day    03/09/2014 tizanidine 4 mg tablet 1 po TID prn spasm      ALLERGIES:  Ingredient Reaction Medication Name Comment  OXYCODONE HCL  Percocet Vomiting  ACETAMINOPHEN  Percocet Vomiting  Reviewed, no changes.   Vitals Date Temp F BP Pulse Ht In Wt Lb BMI BSA Pain Score  03/09/2014  127/75  73 71 249 34.73  3/10        IMPRESSION Patient with persistent intractable left leg pain and weakness who is had previous laminectomy at an outside facility and now comes for relief of his intractable pain.  I have recommended L4 L5 redo laminectomy with L4 L5 and L5-S1 decompression and fusion using MAS PLIF technique.  Completed Orders (this encounter) Order Details Reason Side Interpretation Result Initial Treatment Date Region  Lifestyle education regarding diet Encouraged to eat a well balanced diet and follow up with primary care physician.         Assessment/Plan # Detail Type Description   1. Assessment BMI  34.0-34.9,ADULT (V85.34).   Plan Orders Today's instructions / counseling include(s) Lifestyle education regarding diet.         Pain Assessment/Treatment Pain Scale: 3/10. Method: Numeric Pain Intensity Scale. Location: back. Onset: 02/14/1994. Duration: varies. Quality: discomforting. Pain Assessment/Treatment follow-up plan of care: Patient is currently taking medication for pain as prescribed..  Patient wishes to proceed and surgery is scheduled for March 31, 2014.  Orders: Instruction(s)/Education: Assessment Instruction  V85.34 Lifestyle education regarding diet    MEDICATIONS PRESCRIBED TODAY    Rx Quantity Refills  TIZANIDINE HCL 4 mg  60 0  LORTAB 10 mg-325 mg  60 0            Provider:  Danae Orleans. Venetia Maxon MD  03/21/2014 08:44 AM Dictation edited by: Corey Chen    CC Providers: Corey Harman MD [address] [place], [place] [address] ----------------------------------------------------------------------------------------------------------------------------------------------------------------------         Electronically signed by Corey Orleans. Venetia Maxon MD on 03/21/2014 08:44 AM  > [address] [place], [place] [address] Phone: [phone]   Patient ID:   [id number] Patient: Corey Chen  Date of Birth: 04-Aug-1970 Visit Type: Office Visit   Date: 02/14/2014 09:15 AM Provider: Danae Orleans. Venetia Maxon MD   This 43 year old male presents for back pain and Leg pain.  History of Present Illness: 1.  back pain  2.  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  He notes legs will buckle on occasion.  Lumbar spasms, abdominal pain with standing long periods, bending, and walking.    PT post-op 2014  Lortab 10  2-3/week Ibuprofen   only prn   Hx: HTN, GERD, Umbilical hernia (not yet  evaluated) SxHx: 4-14 lumbar ?laminotomy Corey Chen  X-ray on [facility]  Lumbar radiographs demonstrate levoconvex scoliosis with disc degeneration at L4 L5 and L5-S1 levels without spondylolisthesis.  Patient describes it before his surgery had one year of left leg pain and pain into his big toe.  He says he underwent a foraminotomy and was very sore initially after surgery but then got better after about 2 weeks.  Since that time he is steadily worsened and now complains of terrible pain with position change.  He has not had any recent imaging of the lumbar spine.        PAST MEDICAL/SURGICAL HISTORY   (Detailed)  Disease/disorder Onset Date Management Date Comments    Surgery, lumbar spine 2014   Depression      High cholesterol      Hypertension          Family History  (Detailed)  Relationship Family Member Name Deceased Age at Death Condition Onset Age Cause of Death  Family history of Diabetes mellitus  N      Family history of Hypertension  N      Family history of High cholesterol  N   SOCIAL HISTORY  (Detailed) Tobacco use reviewed. Preferred language is Unknown.   Smoking status: Never smoker.  SMOKING STATUS Use Status Type Smoking Status Usage Per Day Years Used Total Pack Years  no/never  Never smoker             MEDICATIONS(added, continued or stopped this visit):   Started Medication Directions Instruction Stopped   Benicar HCT 40 mg-25 mg tablet take 1 tablet by oral route  every day     ibuprofen 800 mg tablet take 1 tablet by oral route 3 times every day as needed     Lexapro 10 mg tablet take 1 tablet by oral route  every day     Lortab 10 mg-325 mg tablet take 1 tablet by oral route  every 8 hours as needed for pain     Nexium 40 mg capsule,delayed release take 1 capsule by oral route  every day      ALLERGIES:  Ingredient Reaction Medication Name Comment  OXYCODONE HCL  Percocet Vomiting  ACETAMINOPHEN  Percocet Vomiting  Reviewed,  updated.  REVIEW OF SYSTEMS System Neg/Pos Details  Constitutional Negative Chills, fatigue, fever, malaise, night sweats, weight gain and weight loss.  ENMT Negative Ear drainage, hearing loss, nasal drainage, otalgia, sinus pressure and sore throat.  Eyes Negative Eye discharge, eye pain and vision changes.  Respiratory Negative Chronic cough, cough, dyspnea, known TB exposure and wheezing.  Cardio Negative Chest pain, claudication, edema and irregular heartbeat/palpitations.  GI Negative Abdominal pain, blood in stool, change in stool pattern, constipation, decreased appetite, diarrhea, heartburn, nausea and vomiting.  GU Negative Dribbling, dysuria, erectile dysfunction, hematuria, polyuria, slow stream, urinary frequency, urinary incontinence and urinary retention.  Endocrine Negative Cold intolerance, heat intolerance, polydipsia and polyphagia.  Neuro Negative Dizziness, extremity weakness, gait disturbance, headache, memory impairment, numbness in extremity, seizures and tremors.  Psych Negative Anxiety, depression and insomnia.  Integumentary Negative Brittle hair, brittle nails, change in shape/size of mole(s), hair loss, hirsutism, hives, pruritus, rash and skin lesion.  MS Positive Back pain.  Hema/Lymph Negative Easy bleeding, easy bruising and lymphadenopathy.  Allergic/Immuno Negative Contact allergy, environmental allergies, food allergies and seasonal allergies.  Reproductive Negative Penile discharge and sexual dysfunction.    Vitals Date Temp F BP Pulse Ht In Wt Lb BMI BSA Pain Score  02/14/2014  128/80 76 71 250 34.87  2/10     PHYSICAL EXAM General Level of Distress: no acute distress Overall Appearance: normal    Cardiovascular Cardiac: regular rate and rhythm without murmur  Respiratory Lungs: clear to auscultation  Neurological Recent and Remote Memory: normal Attention Span and Concentration:   normal Language: normal Fund of  Knowledge: normal  Right Left Sensation: normal normal Upper Extremity Coordination: normal normal  Lower Extremity Coordination: normal normal  Musculoskeletal Gait and Station: normal  Right Left Upper Extremity Muscle Strength: normal normal Lower Extremity Muscle Strength: normal normal Upper Extremity Muscle Tone:  normal normal Lower Extremity Muscle Tone: normal normal  Motor Strength Upper and lower extremity motor strength was tested in the clinically pertinent muscles.     Deep Tendon Reflexes  Right Left Biceps: normal normal Triceps: normal normal Brachiloradialis: normal normal Patellar: normal normal Achilles: normal normal  Sensory Sensation was tested at L1 to S1.   Cranial Nerves II. Optic  Nerve/Visual Fields: normal III. Oculomotor: normal IV. Trochlear: normal V. Trigeminal: normal VI. Abducens: normal VII. Facial: normal VIII. Acoustic/Vestibular: normal IX. Glossopharyngeal: normal X. Vagus: normal XI. Spinal Accessory: normal XII. Hypoglossal: normal  Motor and other Tests Lhermittes: negative Rhomberg: negative    Right Left Hoffman's: normal normal Clonus: normal normal Babinski: normal normal SLR: negative positive at 45 degrees Patrick's Pearlean Brownie): negative negative Toe Walk: normal normal Toe Lift: normal normal Heel Walk: normal normal SI Joint: nontender nontender   Additional Findings:  Patient has an incision which is off the midline of the lumbar spine.  He has pains buttocks left greater than right and left sciatic notch discomfort to palpation.  He is able to bend to touch his toes and is able to stand on his heels and toes.      IMPRESSION Patient has left lower extremity pain with positive straight leg raise and significant low back pain with limited bending and mobility.  He has had prior lumbar surgery.  I have recommended that a lumbar MRI be obtained with without gadolinium and the patient will follow up with me  after that has been performed.  Completed Orders (this encounter) Order Details Reason Side Interpretation Result Initial Treatment Date Region  Lifestyle education regarding diet Encouraged to eat a well balanced diet and follow up with primary care physician.         Assessment/Plan # Detail Type Description   1. Assessment Lumbar disc degenerative disease (722.52).       2. Assessment Lumbar radiculopathy (724.4).       3. Assessment Lumbar spondylosis (721.3).       4. Assessment Lumbar scoliosis (737.30).       5. Assessment Lumbago (724.2).       6. Assessment BMI 34.0-34.9,ADULT (V85.34).   Plan Orders Today's instructions / counseling include(s) Lifestyle education regarding diet.         Pain Assessment/Treatment Pain Scale: 2/10. Method: Numeric Pain Intensity Scale. Location: back/legs. Onset: 02/14/1994. Duration: varies. Quality: sharp, stabbing. Pain Assessment/Treatment follow-up plan of care: Patient currently taking pain medication as prescribed..  Follow-up after lumbar imaging has been performed  Orders: Diagnostic Procedures: Assessment Procedure  724.4 MRI Spine/lumb With & W/o Contrast  724.4 Return to Clinic after study is performed  Instruction(s)/Education: Assessment Instruction  V85.34 Lifestyle education regarding diet             Provider:  Danae Orleans. Venetia Maxon MD  02/19/2014 04:06 PM Dictation edited by: Corey Chen    CC Providers: Corey Harman MD [address] [place], [place] [address] ----------------------------------------------------------------------------------------------------------------------------------------------------------------------         Electronically signed by Corey Orleans. Venetia Maxon MD on 02/19/2014 04:06 PM

## 2014-03-31 NOTE — Anesthesia Postprocedure Evaluation (Signed)
  Anesthesia Post-op Note  Patient: Corey Chen  Procedure(s) Performed: Procedure(s) with comments: Lumbar four-five, Lumbar five-Sacral one Redo Laminectomy with maximum access posterior lumbar interbody fusion (N/A) - Lumbar four-five, Lumbar five-Sacral one Redo Laminectomy with maximum access posterior lumbar interbody fusion  Patient Location: PACU  Anesthesia Type:General  Level of Consciousness: awake and alert   Airway and Oxygen Therapy: Patient Spontanous Breathing and Patient connected to nasal cannula oxygen  Post-op Pain: moderate  Post-op Assessment: Post-op Vital signs reviewed, Patient's Cardiovascular Status Stable, Respiratory Function Stable and No signs of Nausea or vomiting  Post-op Vital Signs: Reviewed and stable  Last Vitals:  Filed Vitals:   03/31/14 1435  BP: 158/72  Pulse: 87  Temp: 36.2 C  Resp: 20    Complications: No apparent anesthesia complications

## 2014-03-31 NOTE — Op Note (Signed)
03/31/2014  2:42 PM  PATIENT:  Corey Chen  43 y.o. male  PRE-OPERATIVE DIAGNOSIS:  Recurrent lumbar Herniated Nucleus Pulposus, Stenosis, spondylosis, degenerative disc disease, radiculopathy L 45, L 5 S1  POST-OPERATIVE DIAGNOSIS: Recurrent lumbar Herniated Nucleus Pulposus, Stenosis, spondylosis, degenerative disc disease, radiculopathy L 45, L 5 S1   PROCEDURE:  Procedure(s) with comments: Lumbar four-five, Lumbar five-Sacral one Redo Laminectomy with maximum access posterior lumbar interbody fusion (N/A) - Lumbar four-five, Lumbar five-Sacral one Redo Laminectomy with maximum access posterior lumbar interbody fusion with pedicle screw fixation and posterolateral arthrodesis  Wide decompression of all neural elements greater than standard PLIF procedure with dissection of scar tissue and decompression of both L 4, L 5, S 1 nerve roots  SURGEON:  Surgeon(s) and Role:    * Maeola Harman, MD - Primary    * Tressie Stalker, MD - Assisting  PHYSICIAN ASSISTANT:   ASSISTANTS: Poteat, RN   ANESTHESIA:   general  EBL:  Total I/O In: 3750 [I.V.:3500; IV Piggyback:250] Out: 570 [Urine:370; Blood:200]  BLOOD ADMINISTERED:none  DRAINS: none   LOCAL MEDICATIONS USED:  MARCAINE     SPECIMEN:  No Specimen  DISPOSITION OF SPECIMEN:  N/A  COUNTS:  YES  TOURNIQUET:  * No tourniquets in log *  DICTATION: Patient is a 43 year old with spondylosis , stenosis, recurrent disc herniation and severe back and left greater than right lower extremity pain at L4/5 and L 5 S 1 levels of the lumbar spine. It was elected to take him to surgery for redo decompression and  MASPLIF L 45 L 5 S 1 levels with posterolateral arthrodesis.  Procedure:   Following uncomplicated induction of GETA, and placement of electrodes for neural monitoring, patient was turned into a prone position on the [place] tableand using AP  fluoroscopy the area of planned incision was marked, prepped with betadine scrub and  Duraprep, then draped. Exposure was performed of facet joint complex at L 45 level and L 5 S 1 level and the MAS retractor was placed.5.5 x 35 mm cortical Nuvasive screws were placed at L 4 bilaterally according to standard landmarks using neural monitoring.  A total laminectomy of L 4 and L 5 was then performed with disarticulation of facets.  This bone was saved for grafting, combined with Osteocel after being run through bone mill and was placed in bone packing device.  Thorough discectomy was performed bilaterally at L 45 and the endplates were prepared for grafting.  A similar decompression was performed at the L 5 S 1 level.  Decompression was through densely adherent scar tissue through previously operated field and was greater than would be performed in typical PLIF procedure with wide decompression of thecal sac and bilateral L 4, L 5, S 1 nerve roots. 23 x 12 x 12 degree cages were placed in the L 45 interspace and positioning was confirmed with AP and lateral fluoroscopy.  10 cc of autograft/Osteocel was packed in the interspace medial to the second cage and 5 cc was placed lateral to the right cage.   23 x 10 x 12 degree cages were placed at the L 5 S 1 level with 10cc autograft medial to the second cage.  Remaining screws were placed at L 5 (5.5 x 35 mm and at S 1 6.5 x 35 mm ) and 55 mm rods were placed.   And the screws were locked and torqued.Final Xrays showed well positioned implants and screw fixation. The posterolateral region was packed with remaining 25  cc of autograft on the right of midline. The wounds were irrigated and then closed with 1, 2-0 and 3-0 Vicryl stitches. Sterile occlusive dressing was placed with Dermabond and an occlusive dressing. The patient was then extubated in the operating room and taken to recovery in stable and satisfactory condition having tolerated his operation well. Counts were correct at the end of the case.  PLAN OF CARE: Admit to inpatient   PATIENT  DISPOSITION:  PACU - hemodynamically stable.   Delay start of Pharmacological VTE agent (>24hrs) due to surgical blood loss or risk of bleeding: yes

## 2014-03-31 NOTE — Progress Notes (Signed)
Awake, alert, conversant.  Full strength both legs.  Sore in back.

## 2014-03-31 NOTE — Plan of Care (Signed)
Problem: Consults Goal: Diagnosis - Spinal Surgery Outcome: Completed/Met Date Met:  03/31/14 Thoraco/Lumbar Spine Fusion

## 2014-03-31 NOTE — Brief Op Note (Signed)
03/31/2014  2:42 PM  PATIENT:  Corey Chen  43 y.o. male  PRE-OPERATIVE DIAGNOSIS:  Recurrent lumbar Herniated Nucleus Pulposus, Stenosis, spondylosis, degenerative disc disease, radiculopathy L 45, L 5 S1  POST-OPERATIVE DIAGNOSIS: Recurrent lumbar Herniated Nucleus Pulposus, Stenosis, spondylosis, degenerative disc disease, radiculopathy L 45, L 5 S1   PROCEDURE:  Procedure(s) with comments: Lumbar four-five, Lumbar five-Sacral one Redo Laminectomy with maximum access posterior lumbar interbody fusion (N/A) - Lumbar four-five, Lumbar five-Sacral one Redo Laminectomy with maximum access posterior lumbar interbody fusion with pedicle screw fixation and posterolateral arthrodesis  Wide decompression of all neural elements greater than standard PLIF procedure with dissection of scar tissue and decompression of both L 4, L 5, S 1 nerve roots  SURGEON:  Surgeon(s) and Role:    * Maeola Harman, MD - Primary    * Tressie Stalker, MD - Assisting  PHYSICIAN ASSISTANT:   ASSISTANTS: Poteat, RN   ANESTHESIA:   general  EBL:  Total I/O In: 3750 [I.V.:3500; IV Piggyback:250] Out: 570 [Urine:370; Blood:200]  BLOOD ADMINISTERED:none  DRAINS: none   LOCAL MEDICATIONS USED:  MARCAINE     SPECIMEN:  No Specimen  DISPOSITION OF SPECIMEN:  N/A  COUNTS:  YES  TOURNIQUET:  * No tourniquets in log *  DICTATION: Patient is a 43 year old with spondylosis , stenosis, recurrent disc herniation and severe back and left greater than right lower extremity pain at L4/5 and L 5 S 1 levels of the lumbar spine. It was elected to take him to surgery for redo decompression and  MASPLIF L 45 L 5 S 1 levels with posterolateral arthrodesis.  Procedure:   Following uncomplicated induction of GETA, and placement of electrodes for neural monitoring, patient was turned into a prone position on the [place] tableand using AP  fluoroscopy the area of planned incision was marked, prepped with betadine scrub and  Duraprep, then draped. Exposure was performed of facet joint complex at L 45 level and L 5 S 1 level and the MAS retractor was placed.5.5 x 35 mm cortical Nuvasive screws were placed at L 4 bilaterally according to standard landmarks using neural monitoring.  A total laminectomy of L 4 and L 5 was then performed with disarticulation of facets.  This bone was saved for grafting, combined with Osteocel after being run through bone mill and was placed in bone packing device.  Thorough discectomy was performed bilaterally at L 45 and the endplates were prepared for grafting.  A similar decompression was performed at the L 5 S 1 level.  Decompression was through densely adherent scar tissue through previously operated field and was greater than would be performed in typical PLIF procedure with wide decompression of thecal sac and bilateral L 4, L 5, S 1 nerve roots. 23 x 12 x 12 degree cages were placed in the L 45 interspace and positioning was confirmed with AP and lateral fluoroscopy.  10 cc of autograft/Osteocel was packed in the interspace medial to the second cage and 5 cc was placed lateral to the right cage.   23 x 10 x 12 degree cages were placed at the L 5 S 1 level with 10cc autograft medial to the second cage.  Remaining screws were placed at L 5 (5.5 x 35 mm and at S 1 6.5 x 35 mm ) and 55 mm rods were placed.   And the screws were locked and torqued.Final Xrays showed well positioned implants and screw fixation. The posterolateral region was packed with remaining 25  cc of autograft on the right of midline. The wounds were irrigated and then closed with 1, 2-0 and 3-0 Vicryl stitches. Sterile occlusive dressing was placed with Dermabond and an occlusive dressing. The patient was then extubated in the operating room and taken to recovery in stable and satisfactory condition having tolerated his operation well. Counts were correct at the end of the case.  PLAN OF CARE: Admit to inpatient   PATIENT  DISPOSITION:  PACU - hemodynamically stable.   Delay start of Pharmacological VTE agent (>24hrs) due to surgical blood loss or risk of bleeding: yes

## 2014-04-01 MED ORDER — DIAZEPAM 5 MG PO TABS
5.0000 mg | ORAL_TABLET | Freq: Four times a day (QID) | ORAL | Status: DC | PRN
  Administered 2014-04-02: 10 mg via ORAL
  Filled 2014-04-01: qty 2

## 2014-04-01 MED ORDER — METHOCARBAMOL 500 MG PO TABS
500.0000 mg | ORAL_TABLET | Freq: Four times a day (QID) | ORAL | Status: DC | PRN
  Administered 2014-04-01 – 2014-04-02 (×3): 500 mg via ORAL
  Filled 2014-04-01 (×3): qty 1

## 2014-04-01 MED ORDER — HYDROCODONE-ACETAMINOPHEN 10-325 MG PO TABS
1.0000 | ORAL_TABLET | ORAL | Status: DC | PRN
  Administered 2014-04-01 – 2014-04-02 (×6): 2 via ORAL
  Filled 2014-04-01 (×6): qty 2

## 2014-04-01 MED FILL — Sodium Chloride IV Soln 0.9%: INTRAVENOUS | Qty: 1000 | Status: AC

## 2014-04-01 MED FILL — Heparin Sodium (Porcine) Inj 1000 Unit/ML: INTRAMUSCULAR | Qty: 30 | Status: AC

## 2014-04-01 NOTE — Evaluation (Signed)
Physical Therapy Evaluation Patient Details Name: Corey Chen MRN: [id number] DOB: 1971-06-12 Today's Date: 04/01/2014   History of Present Illness  Patient is a 43 y/o male s/p redo laminectomy and PLIF L4, 5 and L5, S1 due to spondylosis and radiculopathy.    Clinical Impression  Patient presents with slowed gait, decreased LE strength and back precautions; however manages to function safely with supervision to modified independent at this time.  Therefore, no further PT needed at this time.  Did discuss with patient and wife possible need for follow up outpatient PT for strengthening when cleared by MD.    Follow Up Recommendations No PT follow up    Equipment Recommendations  None recommended by PT    Recommendations for Other Services       Precautions / Restrictions Precautions Precautions: Back Required Braces or Orthoses: Spinal Brace Spinal Brace: Lumbar corset;Applied in sitting position      Mobility  Bed Mobility Overal bed mobility: Modified Independent             General bed mobility comments: but used bed rail extensively.  Discussed with wife options for rail at home versus chairback or side of mattress  Transfers Overall transfer level: Needs assistance   Transfers: Sit to/from Stand Sit to Stand: Supervision         General transfer comment: increased time, UE reliance min cues for technique with back precautions  Ambulation/Gait Ambulation/Gait assistance: Supervision Ambulation Distance (Feet): 200 Feet Assistive device: None Gait Pattern/deviations: Step-through pattern;Decreased stride length Gait velocity: slow and cautious   General Gait Details: supervision for safety  Stairs            Wheelchair Mobility    Modified Rankin (Stroke Patients Only)       Balance Overall balance assessment: Modified Independent                                           Pertinent Vitals/Pain Pain Assessment:  0-10 Pain Score: 5  Pain Location: mid back Pain Intervention(s): Premedicated before session    Home Living Family/patient expects to be discharged to:: Private residence Living Arrangements: Spouse/significant other Available Help at Discharge: Available PRN/intermittently Type of Home: House Home Access: Level entry     Home Layout: One level;Laundry or work area in [employer]: None      Prior Function Level of Independence: Independent               Higher education careers adviser        Extremity/Trunk Assessment               Lower Extremity Assessment: Generalized weakness      Cervical / Trunk Assessment: Normal  Communication   Communication: No difficulties  Cognition Arousal/Alertness: Awake/alert Behavior During Therapy: WFL for tasks assessed/performed Overall Cognitive Status: Within Functional Limits for tasks assessed                      General Comments General comments (skin integrity, edema, etc.): Educated in precautions with car transfers, kitchen and toileting activities.  OT eval to follow.    Exercises        Assessment/Plan    PT Assessment Patent does not need any further PT services  PT Diagnosis     PT Problem List    PT Treatment Interventions     PT  Goals (Current goals can be found in the Care Plan section) Acute Rehab PT Goals PT Goal Formulation: No goals set, d/c therapy    Frequency     Barriers to discharge        Co-evaluation               End of Session Equipment Utilized During Treatment: Back brace Activity Tolerance: Patient tolerated treatment well Patient left: in bed;with call bell/phone within reach;with family/visitor present      Functional Assessment Tool Used: Clinical Judgement Functional Limitation: Self care Self Care Current Status (Z6109): At least 20 percent but less than 40 percent impaired, limited or restricted Self Care Goal Status (U0454): At least 1 percent  but less than 20 percent impaired, limited or restricted Self Care Discharge Status [phone]): At least 1 percent but less than 20 percent impaired, limited or restricted    Time: 0835-0905 PT Time Calculation (min): 30 min   Charges:   PT Evaluation $Initial PT Evaluation Tier I: 1 Procedure PT Treatments $Gait Training: 8-22 mins $Self Care/Home Management: 8-22   PT G Codes:   Functional Assessment Tool Used: Clinical Judgement Functional Limitation: Self care    [facility] 04/01/2014, 9:15 AM Sheran Lawless, PT [phone] 04/01/2014

## 2014-04-01 NOTE — Progress Notes (Signed)
Pt. Was placed on CPAP auto titrate (min: 4, max: 10) via nasal mask. Pt. Is tolerating CPAP well at this time without any complications.

## 2014-04-01 NOTE — Progress Notes (Signed)
RT note: Asked to come to pts. room to place back on bipap machine, staff attempted-machine would not come on, upon my arrival machine also would not start, noted excessive H20 in circuit with screen showing errors, placed out of service with [employer] contacted, replaced with another bipap machine and adjusted for pt., RT to monitor.

## 2014-04-01 NOTE — Progress Notes (Signed)
Utilization review completed.  

## 2014-04-01 NOTE — Progress Notes (Signed)
Subjective: Patient reports "That IV medicine works a lot better" "Just my back and my hips"  Objective: Vital signs in last 24 hours: Temp:  [97.1 F (36.2 C)-98.8 F (37.1 C)] 98.8 F (37.1 C) (09/11 0425) Pulse Rate:  [80-103] 91 (09/11 0425) Resp:  [18-24] 18 (09/11 0425) BP: (91-158)/(50-82) 91/50 mmHg (09/11 0425) SpO2:  [93 %-100 %] 93 % (09/11 0425)  Intake/Output from previous day: 09/10 0701 - 09/11 0700 In: 4230 [P.O.:480; I.V.:3500; IV Piggyback:250] Out: 3086 [VHQIO:9629; Blood:200] Intake/Output this shift:    Alert, sitting in chair. Reports moderate lumbar & bilat hip pain. No leg pain. Incision with honeycomb drsg & Dermabond. No swelling, erythema, or drainage. Good strength BLE. Ambulated through the night & voiding without difficulty.   Lab Results: No results found for this basename: WBC, HGB, HCT, PLT,  in the last 72 hours BMET No results found for this basename: NA, K, CL, CO2, GLUCOSE, BUN, CREATININE, CALCIUM,  in the last 72 hours  Studies/Results: Dg Lumbar Spine 2-3 Views  03/31/2014   CLINICAL DATA:  Pedicle screw fixation  EXAM: DG C-ARM 61-120 MIN; LUMBAR SPINE - 2-3 VIEW  COMPARISON:  None  FINDINGS: Frontal and lateral views were obtained. There are pedicle screws at L4, L5, and S1 bilaterally. The screws extend through the respective pedicles with the tips of the screws in respective vertebral bodies. There also disc spacers at L4-5 and L5-S1. No fracture or spondylolisthesis.  IMPRESSION: Fixation devices as described. No fracture or spondylolisthesis. Note that on the current examination, the fixation device to each pedicle screw at L4 has not yet been placed.   Electronically Signed   By: Bretta Bang M.D.   On: 03/31/2014 14:32   Dg C-arm 1-60 Min  03/31/2014   CLINICAL DATA:  Pedicle screw fixation  EXAM: DG C-ARM 61-120 MIN; LUMBAR SPINE - 2-3 VIEW  COMPARISON:  None  FINDINGS: Frontal and lateral views were obtained. There are pedicle  screws at L4, L5, and S1 bilaterally. The screws extend through the respective pedicles with the tips of the screws in respective vertebral bodies. There also disc spacers at L4-5 and L5-S1. No fracture or spondylolisthesis.  IMPRESSION: Fixation devices as described. No fracture or spondylolisthesis. Note that on the current examination, the fixation device to each pedicle screw at L4 has not yet been placed.   Electronically Signed   By: Bretta Bang M.D.   On: 03/31/2014 14:32    Assessment/Plan: Improving   LOS: 1 day  Mobilize in LSO. PT/OT eval. Robaxin and increased Valium dose options added & scheduled Tizanidine stopped per DrStern.     [place], [place] 04/01/2014, 7:16 AM

## 2014-04-01 NOTE — Evaluation (Signed)
Occupational Therapy Evaluation Patient Details Name: Corey Chen MRN: [id number] DOB: 12-28-1970 Today's Date: 04/01/2014    History of Present Illness Patient is a 43 y/o male s/p redo laminectomy and PLIF L4, 5 and L5, S1 due to spondylosis and radiculopathy.     Clinical Impression   Patient evaluated by Occupational Therapy with no further acute OT needs identified. All education has been completed and the patient has no further questions. See below for any follow-up Occupational Therapy or equipment needs. OT to sign off. Thank you for referral.   Pt is at adequate level for d/c home.     Follow Up Recommendations  No OT follow up    Equipment Recommendations  None recommended by OT    Recommendations for Other Services       Precautions / Restrictions Precautions Precautions: Back Precaution Comments: handout provided in room and reviewed Required Braces or Orthoses: Spinal Brace Spinal Brace: Lumbar corset;Applied in sitting position Restrictions Weight Bearing Restrictions: No      Mobility Bed Mobility Overal bed mobility: Modified Independent             General bed mobility comments: able to complete without rails  Transfers Overall transfer level: Needs assistance   Transfers: Sit to/from Stand Sit to Stand: Supervision         General transfer comment: able to don brace appropriately    Balance Overall balance assessment: Modified Independent                                          ADL Overall ADL's : Needs assistance/impaired Eating/Feeding: Independent   Grooming: Wash/dry hands;Supervision/safety   Upper Body Bathing: Supervision/ safety               Toilet Transfer: Supervision/safety       Tub/ Shower Transfer: Supervision/safety   Functional mobility during ADLs: Supervision/safety General ADL Comments: Pt demonstrates bed mobility, toilet transfer, tub transfer, don doff brace, recalled 2 out 3  precautions ( no recall of arching) and ambulation without DME. Wife present and educated in addition to patient     Vision                     Perception     Praxis      Pertinent Vitals/Pain Pain Assessment: 0-10 Pain Score: 5  Pain Location: midback Pain Intervention(s): Premedicated before session     Hand Dominance Right   Extremity/Trunk Assessment Upper Extremity Assessment Upper Extremity Assessment: Overall WFL for tasks assessed   Lower Extremity Assessment Lower Extremity Assessment: Defer to PT evaluation   Cervical / Trunk Assessment Cervical / Trunk Assessment: Normal   Communication Communication Communication: No difficulties   Cognition Arousal/Alertness: Awake/alert Behavior During Therapy: WFL for tasks assessed/performed Overall Cognitive Status: Within Functional Limits for tasks assessed                     General Comments       Exercises       Shoulder Instructions      Home Living Family/patient expects to be discharged to:: Private residence Living Arrangements: Spouse/significant other Available Help at Discharge: Available PRN/intermittently Type of Home: House Home Access: Level entry     Home Layout: One level;Laundry or work area in basement     [employer] Shower/Tub: Chief Strategy Officer: Standard  Home Equipment: None          Prior Functioning/Environment Level of Independence: Independent             OT Diagnosis:     OT Problem List:     OT Treatment/Interventions:      OT Goals(Current goals can be found in the care plan section)    OT Frequency:     Barriers to D/C:            Co-evaluation              End of Session Equipment Utilized During Treatment: Gait belt;Back brace Nurse Communication: Mobility status  Activity Tolerance: Patient tolerated treatment well Patient left: Other (comment) (ambulating again with wife)   Time: 1610-9604 OT Time  Calculation (min): 12 min Charges:  OT General Charges $OT Visit: 1 Procedure OT Evaluation $Initial OT Evaluation Tier I: 1 Procedure OT Treatments $Self Care/Home Management : 8-22 mins G-Codes:    Boone Master B 04-08-14, 11:45 AM Pager: [phone]

## 2014-04-02 MED ORDER — HYDROCODONE-ACETAMINOPHEN 10-325 MG PO TABS: 1.0000 | ORAL_TABLET | Freq: Four times a day (QID) | ORAL | Status: DC | PRN

## 2014-04-02 MED ORDER — METHOCARBAMOL 500 MG PO TABS: 500.0000 mg | ORAL_TABLET | Freq: Four times a day (QID) | ORAL | Status: DC | PRN

## 2014-04-02 NOTE — Discharge Summary (Signed)
Physician Discharge Summary  Patient ID: Fed Ceci MRN: [id number] DOB/AGE: 1970/07/28 43 y.o.  Admit date: 03/31/2014 Discharge date: 04/02/2014  Admission Diagnoses: Lumbar spondylosis    Discharge Diagnoses: Same   Discharged Condition: good  Hospital Course: The patient was admitted on 03/31/2014 and taken to the operating room where the patient underwent lumbar fusion. The patient tolerated the procedure well and was taken to the recovery room and then to the floor in stable condition. The hospital course was routine. There were no complications. The wound remained clean dry and intact. Pt had appropriate back soreness. No complaints of leg pain or new N/T/W. The patient remained afebrile with stable vital signs, and tolerated a regular diet. The patient continued to increase activities, and pain was well controlled with oral pain medications.   Consults: None  Significant Diagnostic Studies:  Results for orders placed during the hospital encounter of 03/24/14  SURGICAL PCR SCREEN      Result Value Ref Range   MRSA, PCR NEGATIVE  NEGATIVE   Staphylococcus aureus POSITIVE (*) NEGATIVE  BASIC METABOLIC PANEL      Result Value Ref Range   Sodium 140  137 - 147 mEq/L   Potassium 4.1  3.7 - 5.3 mEq/L   Chloride 103  96 - 112 mEq/L   CO2 22  19 - 32 mEq/L   Glucose, Bld 104 (*) 70 - 99 mg/dL   BUN 15  6 - 23 mg/dL   Creatinine, Ser 0.86  0.50 - 1.35 mg/dL   Calcium 9.6  8.4 - 57.8 mg/dL   GFR calc non Af Amer >90  >90 mL/min   GFR calc Af Amer >90  >90 mL/min   Anion gap 15  5 - 15  CBC      Result Value Ref Range   WBC 5.2  4.0 - 10.5 K/uL   RBC 5.54  4.22 - 5.81 MIL/uL   Hemoglobin 16.7  13.0 - 17.0 g/dL   HCT 46.9  62.9 - 52.8 %   MCV 84.1  78.0 - 100.0 fL   MCH 30.1  26.0 - 34.0 pg   MCHC 35.8  30.0 - 36.0 g/dL   RDW 41.3  24.4 - 01.0 %   Platelets 161  150 - 400 K/uL  TYPE AND SCREEN      Result Value Ref Range   ABO/RH(D) B POS     Antibody Screen NEG     Sample Expiration 04/07/2014    ABO/RH      Result Value Ref Range   ABO/RH(D) B POS      Dg Chest 2 View  03/24/2014   CLINICAL DATA:  Preop film for lumbar surgery  EXAM: CHEST  2 VIEW  COMPARISON:  None.  FINDINGS: The heart size and mediastinal contours are within normal limits. Both lungs are clear. The visualized skeletal structures are unremarkable.  IMPRESSION: No active cardiopulmonary disease.   Electronically Signed   By: Esperanza Heir M.D.   On: 03/24/2014 11:57   Dg Lumbar Spine 2-3 Views  03/31/2014   CLINICAL DATA:  Pedicle screw fixation  EXAM: DG C-ARM 61-120 MIN; LUMBAR SPINE - 2-3 VIEW  COMPARISON:  None  FINDINGS: Frontal and lateral views were obtained. There are pedicle screws at L4, L5, and S1 bilaterally. The screws extend through the respective pedicles with the tips of the screws in respective vertebral bodies. There also disc spacers at L4-5 and L5-S1. No fracture or spondylolisthesis.  IMPRESSION: Fixation devices as  described. No fracture or spondylolisthesis. Note that on the current examination, the fixation device to each pedicle screw at L4 has not yet been placed.   Electronically Signed   By: Bretta Bang M.D.   On: 03/31/2014 14:32   Dg C-arm 1-60 Min  03/31/2014   CLINICAL DATA:  Pedicle screw fixation  EXAM: DG C-ARM 61-120 MIN; LUMBAR SPINE - 2-3 VIEW  COMPARISON:  None  FINDINGS: Frontal and lateral views were obtained. There are pedicle screws at L4, L5, and S1 bilaterally. The screws extend through the respective pedicles with the tips of the screws in respective vertebral bodies. There also disc spacers at L4-5 and L5-S1. No fracture or spondylolisthesis.  IMPRESSION: Fixation devices as described. No fracture or spondylolisthesis. Note that on the current examination, the fixation device to each pedicle screw at L4 has not yet been placed.   Electronically Signed   By: Bretta Bang M.D.   On: 03/31/2014 14:32    Antibiotics:  Anti-infectives    Start     Dose/Rate Route Frequency Ordered Stop   03/31/14 1800  ceFAZolin (ANCEF) IVPB 1 g/50 mL premix     1 g 100 mL/hr over 30 Minutes Intravenous Every 8 hours 03/31/14 1735 04/01/14 0509   03/31/14 0600  ceFAZolin (ANCEF) IVPB 2 g/50 mL premix     2 g 100 mL/hr over 30 Minutes Intravenous On call to O.R. 03/30/14 1410 03/31/14 1016      Discharge Exam: Blood pressure 109/60, pulse 80, temperature 97.6 F (36.4 C), temperature source Oral, resp. rate 18, height  (1.803 m), weight 112.946 kg (249 lb), SpO2 95.00%. Neurologic: Grossly normal Incision okay  Discharge Medications:     Medication List         dextromethorphan-guaiFENesin 30-600 MG per 12 hr tablet  Commonly known as:  MUCINEX DM  Take 2 tablets by mouth daily.     esomeprazole 40 MG capsule  Commonly known as:  NEXIUM  Take 40 mg by mouth daily at 12 noon.     HYDROcodone-acetaminophen 10-325 MG per tablet  Commonly known as:  NORCO  Take 1-2 tablets by mouth every 6 (six) hours as needed.     methocarbamol 500 MG tablet  Commonly known as:  ROBAXIN  Take 1 tablet (500 mg total) by mouth every 6 (six) hours as needed for muscle spasms.     olmesartan-hydrochlorothiazide 40-25 MG per tablet  Commonly known as:  BENICAR HCT  Take 1 tablet by mouth daily.     tiZANidine 4 MG tablet  Commonly known as:  ZANAFLEX  Take 4 mg by mouth at bedtime.        Disposition: Home   Final Dx: Lumbar fusion      Discharge Instructions   Call MD for:  difficulty breathing, headache or visual disturbances    Complete by:  As directed      Call MD for:  persistant nausea and vomiting    Complete by:  As directed      Call MD for:  redness, tenderness, or signs of infection (pain, swelling, redness, odor or green/yellow discharge around incision site)    Complete by:  As directed      Call MD for:  severe uncontrolled pain    Complete by:  As directed      Call MD for:  temperature >100.4    Complete  by:  As directed      Diet - low sodium heart healthy  Complete by:  As directed      Discharge instructions    Complete by:  As directed   No driving, no strenuous activity, may shower normally, no heavy lifting or bending or twisting     Increase activity slowly    Complete by:  As directed      Remove dressing in 48 hours    Complete by:  As directed               Signed: JONES,DAVID S 04/02/2014, 8:59 AM

## 2014-04-02 NOTE — Progress Notes (Signed)
Patient alert and oriented, mae's well, voiding adequate amount of urine, swallowing without difficulty, c/o moderate pain and meds given prior to discharged. Patient discharged home with family. Script and discharged instructions given to patient. Patient and family stated understanding of d/c instructions given and agree to call the office to schedule f/u appointment.

## 2014-04-02 NOTE — Progress Notes (Signed)
Pt. Has already placed himself on CPAP via nasal mask & is tolerating it well at this time.

## 2014-04-02 NOTE — Discharge Instructions (Signed)

## 2014-04-04 ENCOUNTER — Encounter (HOSPITAL_COMMUNITY): Payer: Self-pay | Admitting: Neurosurgery

## 2016-02-14 IMAGING — CR DG CHEST 2V
2 series · 2 of 2 positions shown · non-contrast
Comparison: None.

CLINICAL DATA: Preop film for lumbar surgery

EXAM:
CHEST  2 VIEW

[w chest pa]
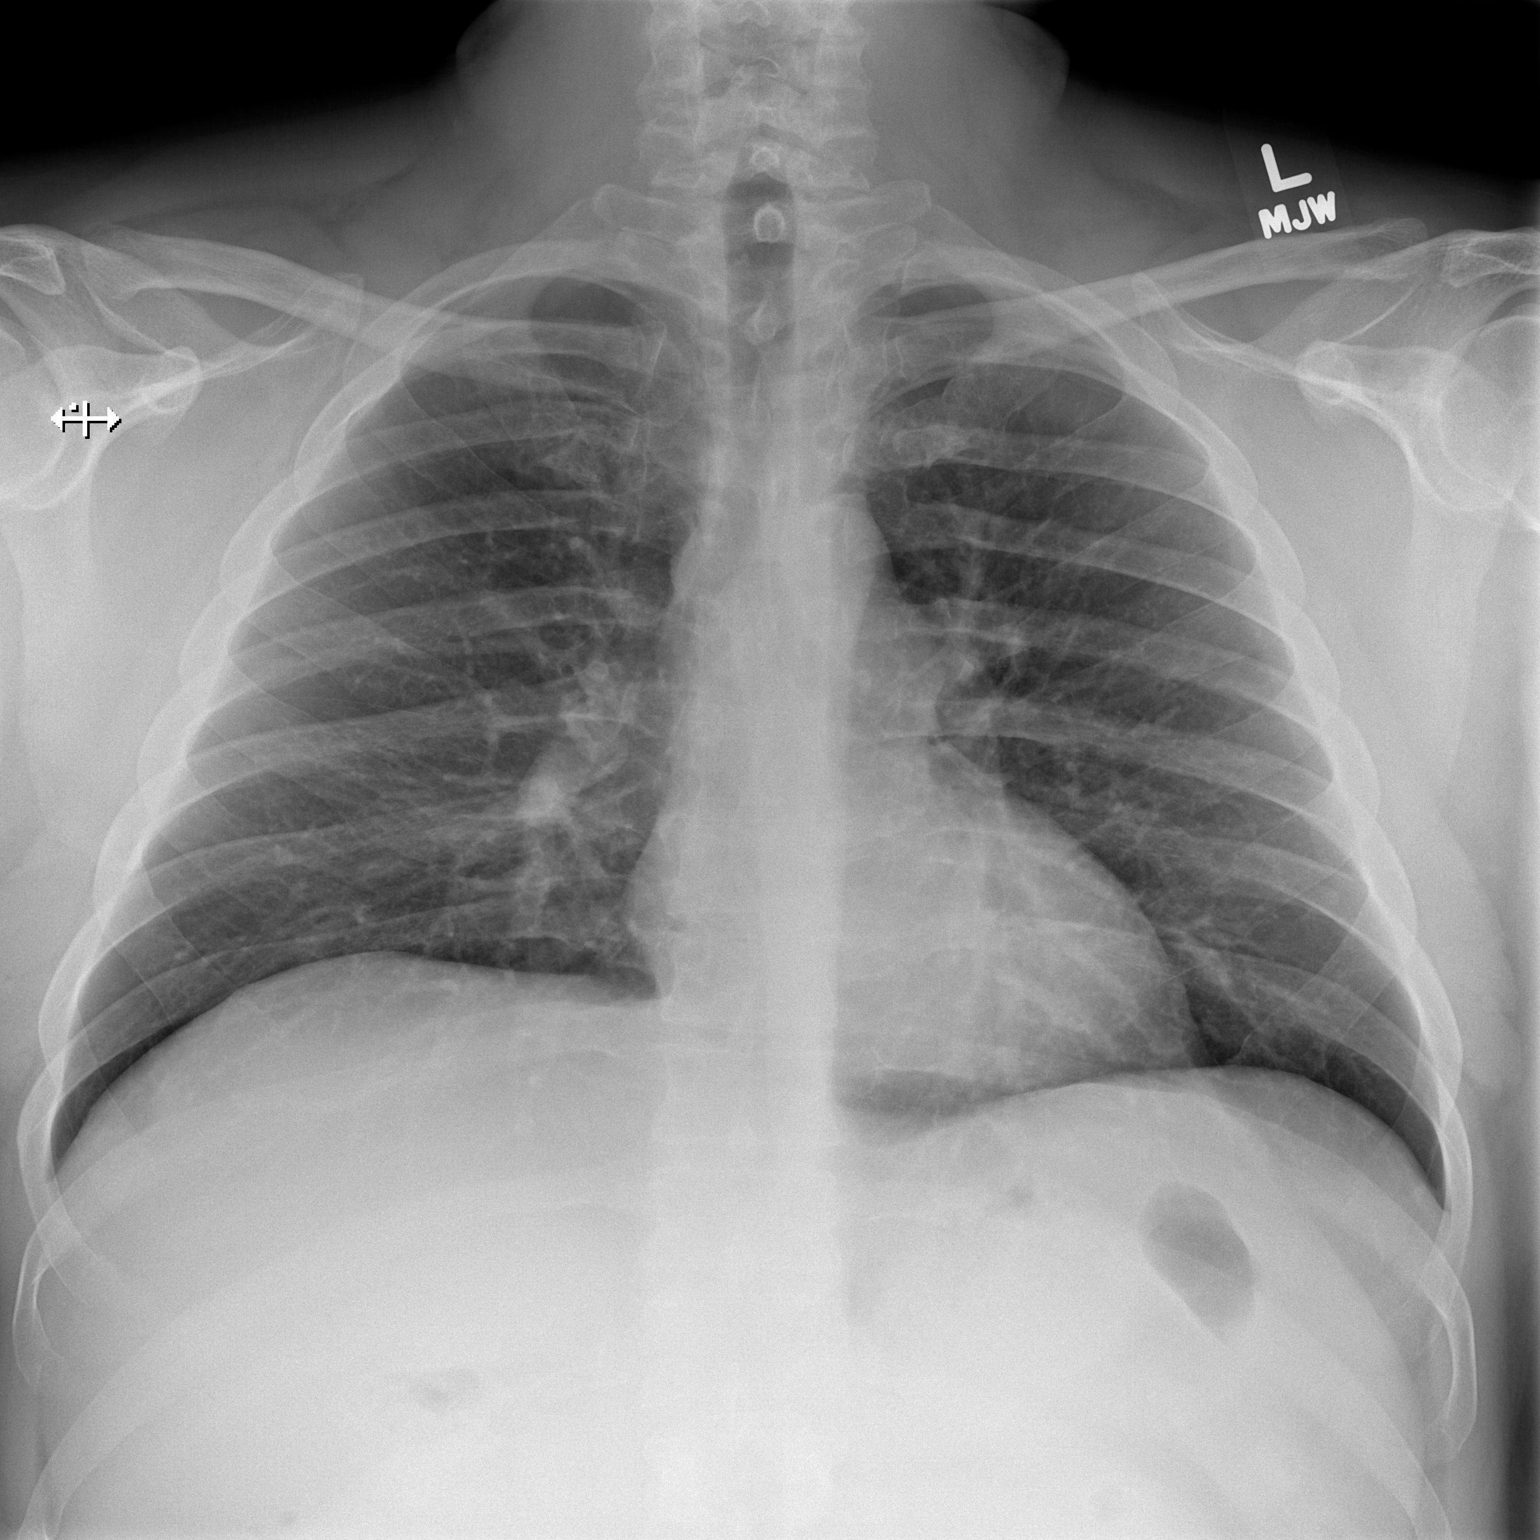

[w chest lat]
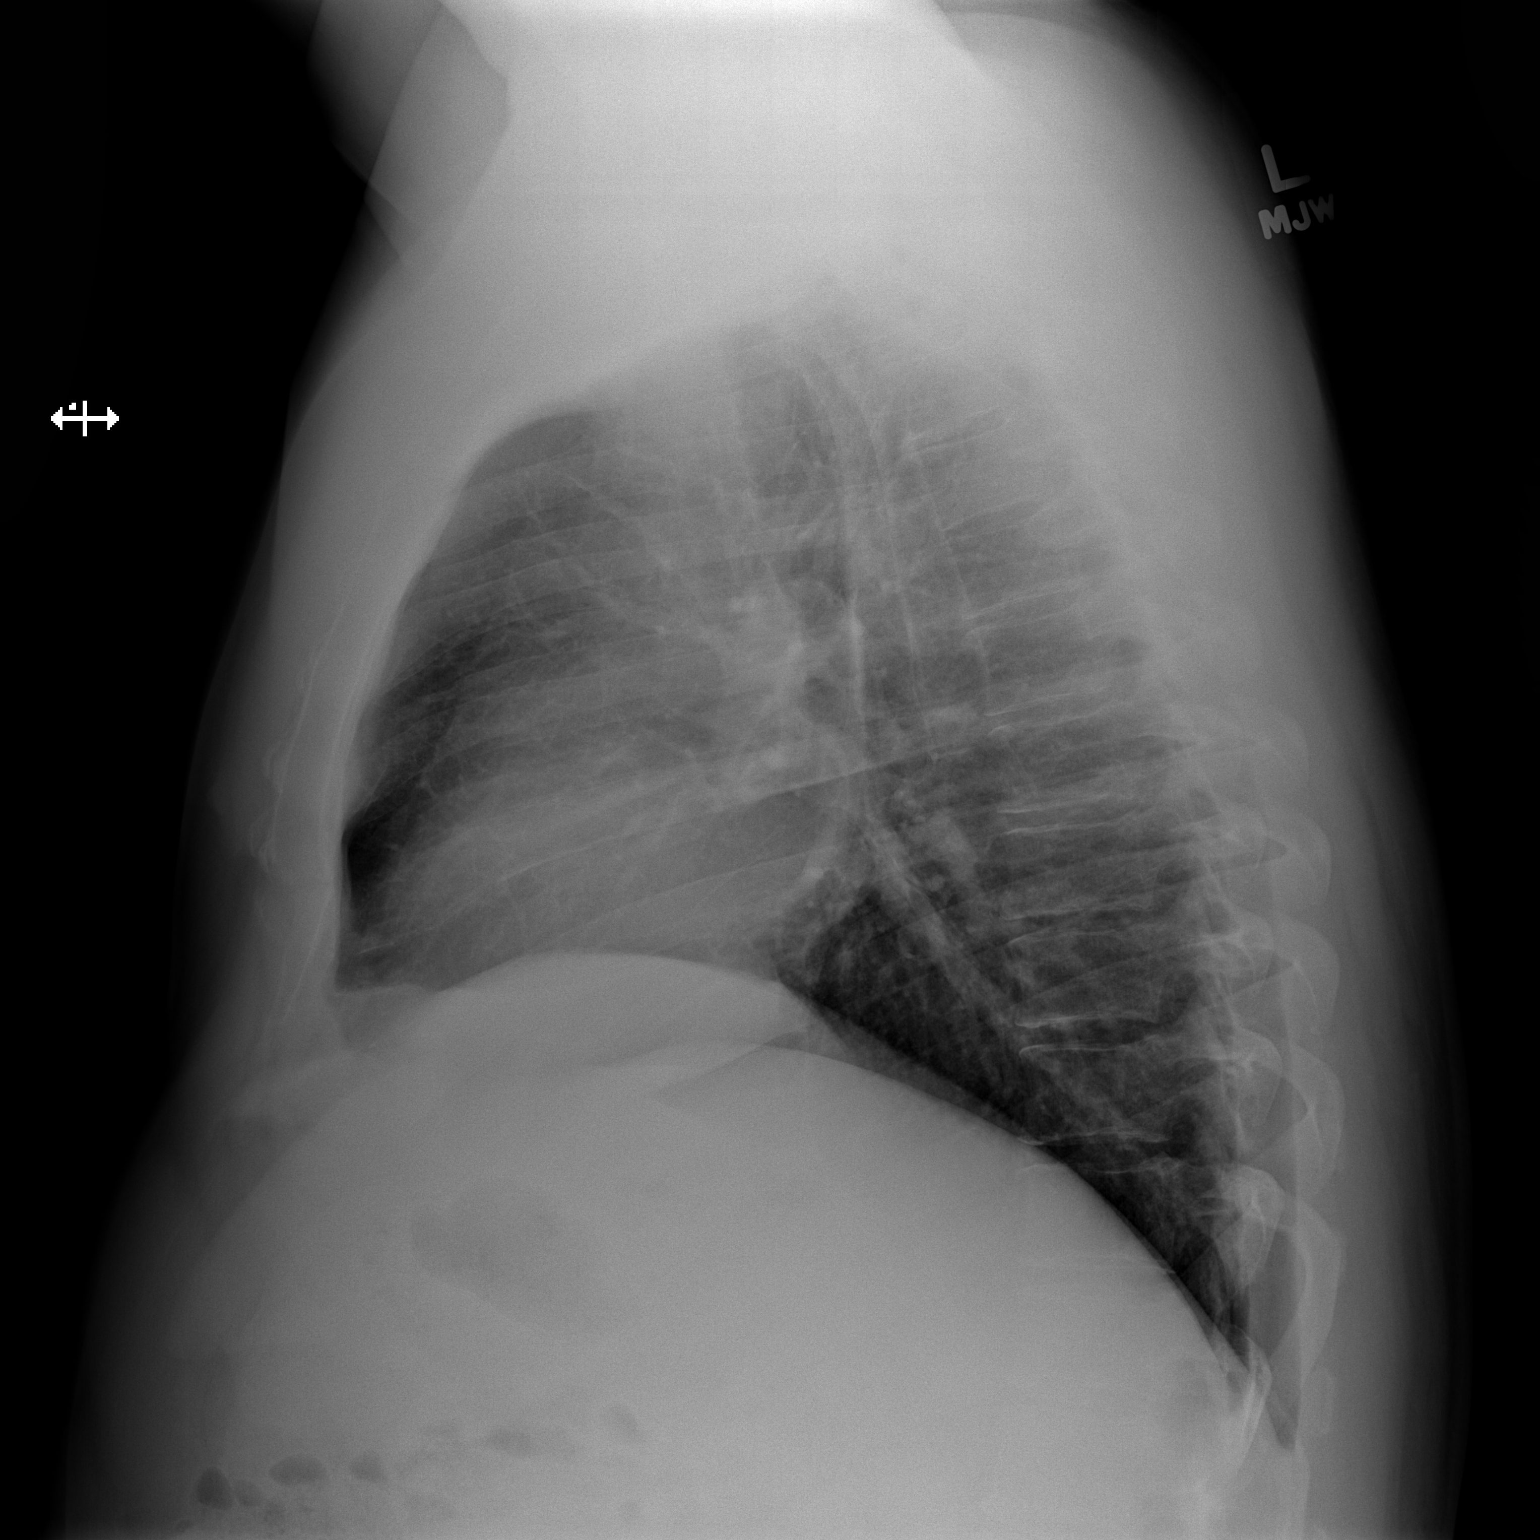

[2 of 2 positions shown; findings below may reference images not displayed]

FINDINGS: The heart size and mediastinal contours are within normal limits.
Both lungs are clear. The visualized skeletal structures are
unremarkable.
IMPRESSION: No active cardiopulmonary disease.

## 2016-02-21 IMAGING — RF DG LUMBAR SPINE 2-3V
1 series · 3 of 3 positions shown · non-contrast
Comparison: None

CLINICAL DATA: Pedicle screw fixation

EXAM:
DG C-ARM 61-120 MIN; LUMBAR SPINE - 2-3 VIEW

[Series 1: run · 3 of 3 slices shown]
[im 1/3]
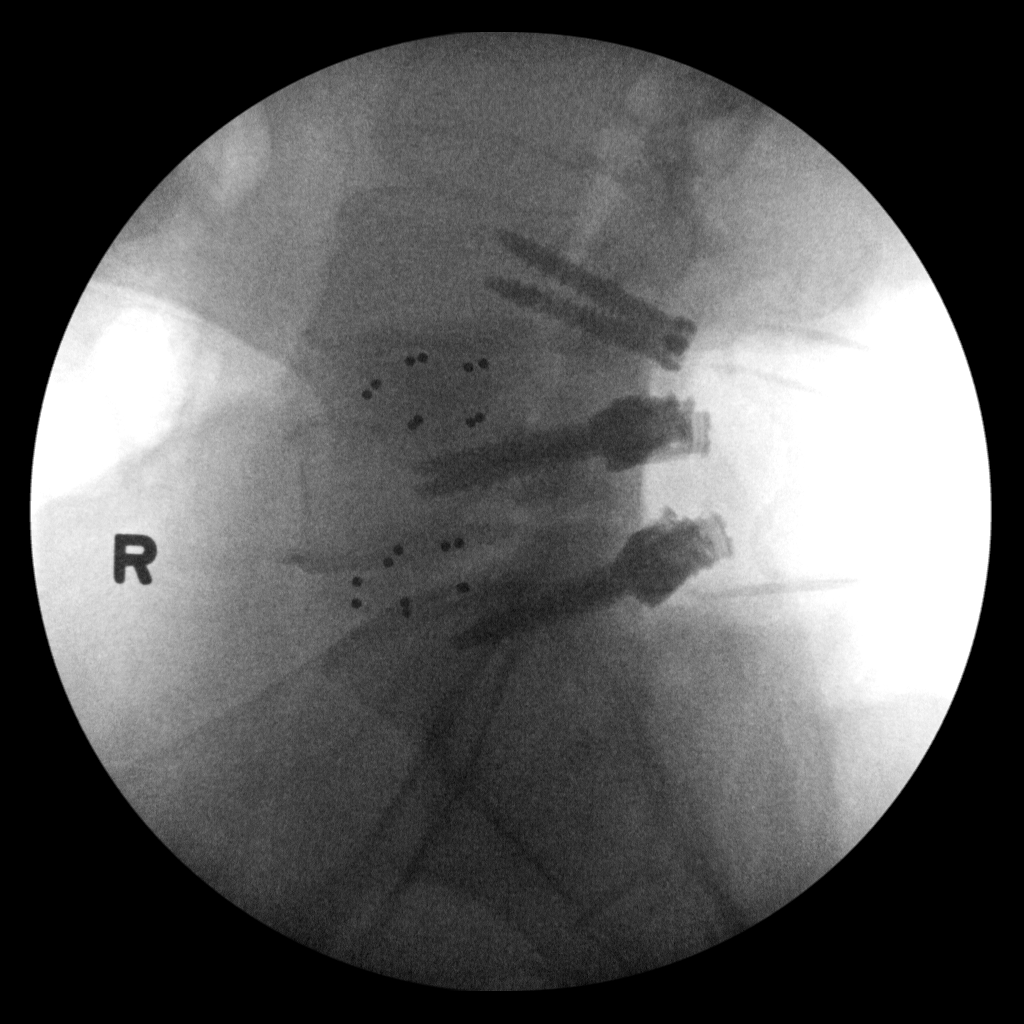
[im 2/3]
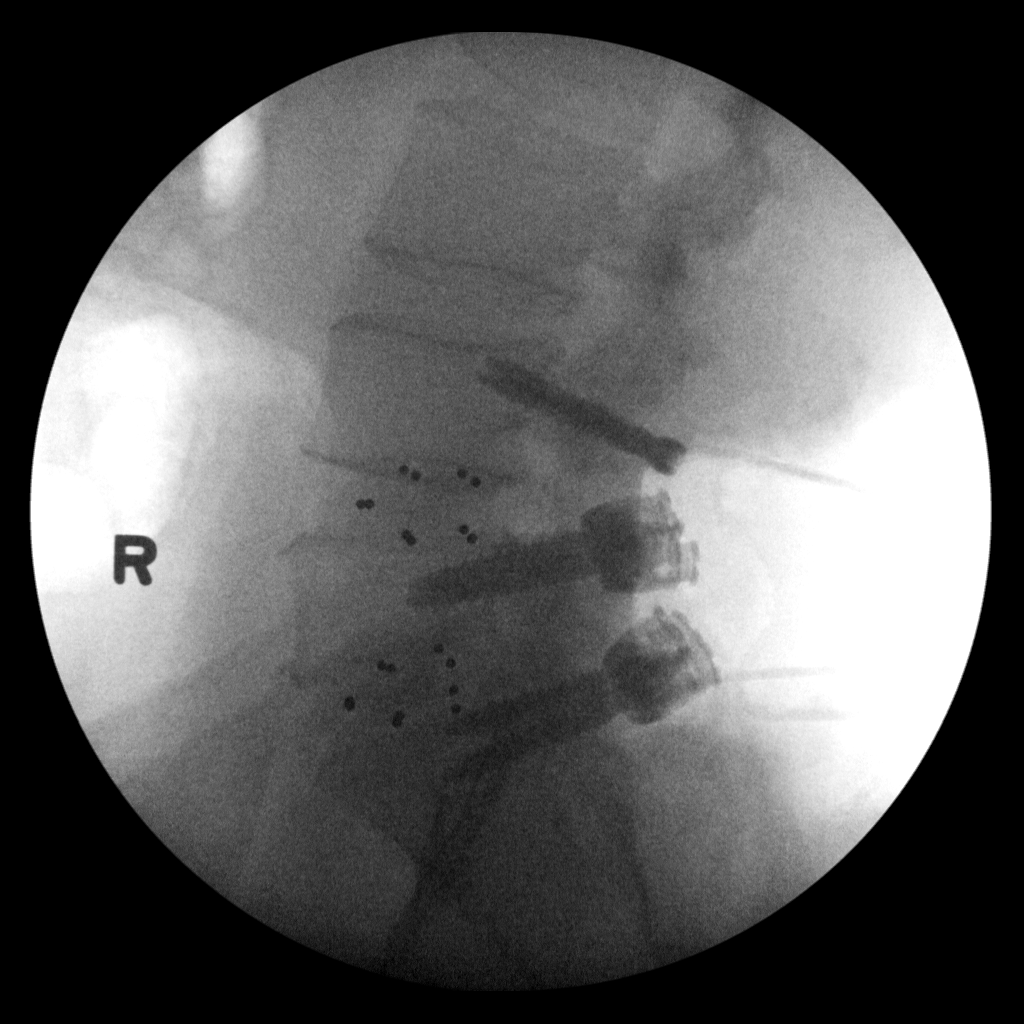
[im 3/3]
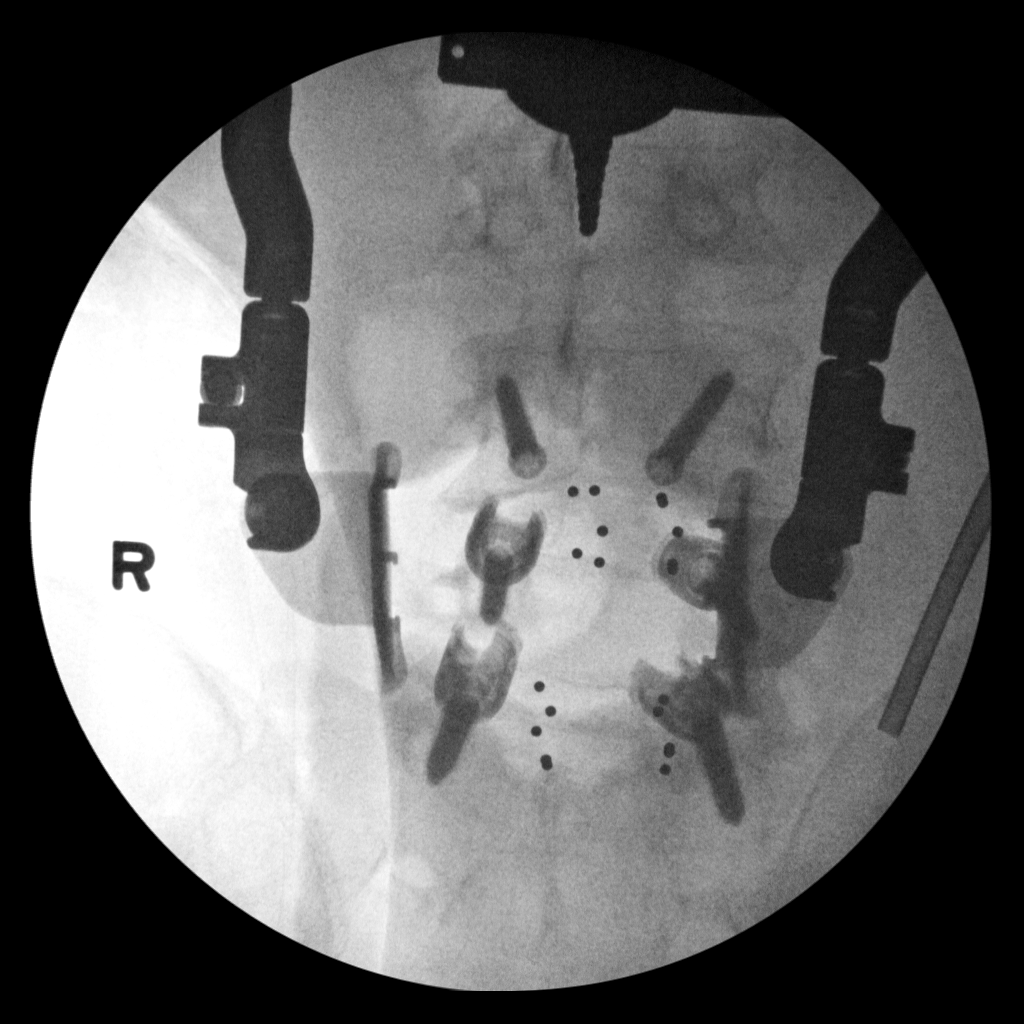

[3 of 3 positions shown; findings below may reference images not displayed]

FINDINGS: Frontal and lateral views were obtained. There are pedicle screws at
L4, L5, and S1 bilaterally. The screws extend through the respective
pedicles with the tips of the screws in respective vertebral bodies.
There also disc spacers at L4-5 and L5-S1. No fracture or
spondylolisthesis.
IMPRESSION: Fixation devices as described. No fracture or spondylolisthesis.
Note that on the current examination, the fixation device to each
pedicle screw at L4 has not yet been placed.

## 2018-04-28 ENCOUNTER — Encounter: Payer: Self-pay | Admitting: Psychiatry

## 2018-04-28 ENCOUNTER — Ambulatory Visit (INDEPENDENT_AMBULATORY_CARE_PROVIDER_SITE_OTHER): Payer: Self-pay | Admitting: Psychiatry

## 2018-04-28 VITALS — BP 137/93 | HR 76 | Ht 71.0 in

## 2018-04-28 DIAGNOSIS — F332 Major depressive disorder, recurrent severe without psychotic features: Secondary | ICD-10-CM

## 2018-04-28 DIAGNOSIS — F4310 Post-traumatic stress disorder, unspecified: Secondary | ICD-10-CM

## 2018-04-28 DIAGNOSIS — F5105 Insomnia due to other mental disorder: Secondary | ICD-10-CM

## 2018-04-28 MED ORDER — DOXAZOSIN MESYLATE 2 MG PO TABS: ORAL_TABLET | ORAL | 1 refills | Status: DC

## 2018-04-28 MED ORDER — PROPRANOLOL HCL 10 MG PO TABS: ORAL_TABLET | ORAL | 2 refills | Status: DC
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
## 2018-05-05 ENCOUNTER — Ambulatory Visit: Payer: Self-pay | Admitting: Mental Health

## 2018-05-05 DIAGNOSIS — F4310 Post-traumatic stress disorder, unspecified: Secondary | ICD-10-CM

## 2018-05-05 DIAGNOSIS — F331 Major depressive disorder, recurrent, moderate: Secondary | ICD-10-CM
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
## 2018-05-19 ENCOUNTER — Ambulatory Visit: Payer: Self-pay | Admitting: Mental Health

## 2018-05-19 DIAGNOSIS — F331 Major depressive disorder, recurrent, moderate: Secondary | ICD-10-CM

## 2018-05-19 DIAGNOSIS — F4310 Post-traumatic stress disorder, unspecified: Secondary | ICD-10-CM

## 2018-05-19 NOTE — Progress Notes (Signed)
      [facility] Counselor/Therapist Progress Note   Patient ID: Corey Chen, MRN: [id number]  Date: 05/19/2018  Timespent:  54 minutes  Treatment Type: Individual therapy  Subjective: Patient arrived on time for today's session.  Discussed progress in recent events since our last visit.  Assisted patient in identifying needs.  Patient stated he would like to feel "happy" again, more energy.  Patient went on to share differences, how he used to be more confident and self-assured in the past.  He stated that he and his ex-wife had communicated recently and how she would like to try to reunite possibly.  Patient stated that he is not wanting to reunite at this time due to the history of issues.  Patient denied having nightmares or intrusive thoughts.  Patient stated that he continues to cope with low motivation, fatigue and anhedonia.  He stated that he does not hunt and fish like he used to.  Encouraged him to schedule and plan pleasurable activities.  Discussed mindfulness concepts, coping strategies.  Interventions:CBT and Strength-based  Mental Status Exam:   Appearance:   Casual     Behavior:  Appropriate  Motor:  Restlestness  Speech/Language:   Clear and Coherent and Pressured  Affect:  constricted  Mood:  anxious and depressed  Thought process:  Coherent and Relevant  Thought content:    Denies SI/HI, no AVH   Perceptual disturbances:    none  Orientation:  Full (Time, Place, and Person)  Attention:  Good  Concentration:  good  Memory:  Immediate  Fund of knowledge:   Good  Insight:    good  Judgment:   Good  Impulse Control:  good    Reported Symptoms:  Daily depressed mood, anxiety, isolative bx, sleep disturbance, anhedonia, withdrawn, fatigue  Risk Assessment: Danger to Self:  No Self-injurious Behavior: No Danger to Others: No Duty to Warn:no Physical Aggression / Violence:none Access to Firearms a concern: No  Gang Involvement:No   Diagnosis:   ICD-10-CM    1. Major depressive disorder, recurrent episode, moderate (HCC) F33.1   2. PTSD (post-traumatic stress disorder) F43.10      Plan:  1.  Patient to continue to engage in individual counseling 2-4 times a month or as needed. 2.  Patient to identify and apply CBT, coping skills learned in session to decrease depression and anxiety symptoms. 3.  Patient to decrease negative or critical self talk to increase self-acceptance. 4.  Patient to identify and process feelings of grief and loss with increasing acceptance. 5.  Patient to decrease symptoms associated with past trauma. 6.  Patient to contact this office, go to the local ED or call 911 if a crisis or emergency develops between visits.  [name] Session, [facility]

## 2018-06-09 ENCOUNTER — Ambulatory Visit: Payer: Self-pay | Admitting: Mental Health

## 2018-06-09 DIAGNOSIS — F331 Major depressive disorder, recurrent, moderate: Secondary | ICD-10-CM

## 2018-06-09 NOTE — Progress Notes (Signed)
      [facility] Counselor/Therapist Progress Note   Patient ID: Corey Chen, MRN: [id number]  Date: 06/09/2018  Timespent:  54 minutes  Treatment Type: Individual therapy  Subjective: Patient arrived on time for today's session.  Discussed progress in recent events since our last visit.  He shared a recent situation where he and his daughter went to a local music event.  He stated that he got upset, due to her being on stage and dancing provocatively.  He stated that he got upset, raise his voice at her, telling her that she should not act that way.  He stated that later, he was arrested for disorderly conduct due to raising his voice at a woman who was continually trying to talk to him, telling her to leave him alone.  He stated that at this point, an Technical sales engineerofficer arrested him.  He stated that he wants to have improved communication with his daughter, that they typically get along very well.  He stated he has tried to do so and awaits her response.  He shared how he is close to all of his daughters, how he wishes his life was "like it used to be" .  Patient was reminded of the father he has defined through previous sessions, the relationships he continues to cultivate with his daughters, how they seek him out for discussion and guidance.  Encouraged him to give the situation time, that there are strong relationship can heal.  He stated that he has made efforts to get out, go hunting, trying to do things he used to enjoy.  He said that he continues to feel depressed, but is trying to persevere.  We reviewed mindfulness concepts, coping strategies.   Interventions:CBT and Strength-based  Mental Status Exam:   Appearance:   Casual     Behavior:  Appropriate  Motor:  Restlestness  Speech/Language:   Clear and Coherent and Pressured  Affect:  constricted  Mood:  anxious and depressed  Thought process:  Coherent and Relevant  Thought content:    Denies SI/HI, no AVH   Perceptual disturbances:     none  Orientation:  Full (Time, Place, and Person)  Attention:  Good  Concentration:  good  Memory:  Immediate  Fund of knowledge:   Good  Insight:    good  Judgment:   Good  Impulse Control:  good    Reported Symptoms:  Daily depressed mood, anxiety, isolative bx, sleep disturbance, anhedonia, withdrawn, fatigue  Risk Assessment: Danger to Self:  No Self-injurious Behavior: No Danger to Others: No Duty to Warn:no Physical Aggression / Violence:none Access to Firearms a concern: No  Gang Involvement:No   Diagnosis:   ICD-10-CM   1. Major depressive disorder, recurrent episode, moderate (HCC) F33.1      Plan:  1.  Patient to continue to engage in individual counseling 2-4 times a month or as needed. 2.  Patient to identify and apply CBT, coping skills learned in session to decrease depression and anxiety symptoms. 3.  Patient to decrease negative or critical self talk to increase self-acceptance. 4.  Patient to identify and process feelings of grief and loss with increasing acceptance. 5.  Patient to decrease symptoms associated with past trauma. 6.  Patient to contact this office, go to the local ED or call 911 if a crisis or emergency develops between visits.  [name] Sessionhristopher Amellia Panik, [facility]

## 2018-07-02 ENCOUNTER — Ambulatory Visit: Payer: Self-pay | Admitting: Mental Health

## 2018-07-24 ENCOUNTER — Other Ambulatory Visit: Payer: Self-pay | Admitting: Psychiatry

## 2018-07-29 ENCOUNTER — Ambulatory Visit: Payer: Self-pay | Admitting: Psychiatry

## 2018-07-29 ENCOUNTER — Encounter: Payer: Self-pay | Admitting: Psychiatry

## 2018-07-29 ENCOUNTER — Ambulatory Visit: Payer: Self-pay | Admitting: Mental Health

## 2018-07-29 VITALS — BP 146/79 | HR 93

## 2018-07-29 DIAGNOSIS — F331 Major depressive disorder, recurrent, moderate: Secondary | ICD-10-CM

## 2018-07-29 DIAGNOSIS — F5105 Insomnia due to other mental disorder: Secondary | ICD-10-CM

## 2018-07-29 DIAGNOSIS — F4310 Post-traumatic stress disorder, unspecified: Secondary | ICD-10-CM

## 2018-07-29 MED ORDER — DOXAZOSIN MESYLATE 4 MG PO TABS: 4.0000 mg | ORAL_TABLET | Freq: Every day | ORAL | 0 refills | Status: DC

## 2018-07-29 MED ORDER — BUSPIRONE HCL 15 MG PO TABS: 15.0000 mg | ORAL_TABLET | Freq: Two times a day (BID) | ORAL | 1 refills | Status: DC

## 2018-07-29 NOTE — Progress Notes (Signed)
Corey Chen [id number] [birth year] 48 y.o.  Subjective:   Patient ID:  Corey Chen is a 48 y.o. (DOB Feb 24, 1971) male.  Chief Complaint:  Chief Complaint  Patient presents with  . Anxiety  . Depression    HPI Corey Chen presents to the office today for follow-up of depression, anxiety, and insomnia. He reports that he has episodes of worsening anxiety and mood. Reports periods of social withdrawal and isolate himself at home for 2-3 days. He reports that Doxazosin has been helpful for nightmares. He reports that he continues to have dreams but "more like everyday stuff." He reports that nightmares are less frequent and less severe. Reports that he is also able to fall asleep more easily. Reports that he is periodically awakened during the night due to pain. Reports that he has increased anxiety in certain situations, like crowds and being away from home- "I don't like being around anything new" and notices he does best when he is at home and/or around people that he is closest with. For instance, says that he went to pick up meds at pharmacy last night and the line was very long and he left without getting prescriptions due to anxiety. He reports that he continues to have some intrusive memories.   Notices mood is more depressed on rainy days. Reports that mood is "alright" at times and then has other periods where mood is lower and feels persistently lonely or sad. Notices he will occasionally notice his mood will become sad for several days in response to something that was said. He denies any severe irritability and that this has improved. He reports poor concentration and procrastination. Reports that he will put off paying bills "because I don't want to have to do it." He reports that motivation is often low. He reports that energy has been low and thinks that this is also due to physical limitations. Reports that if he does too much physically he will feel exhausted afterwards. He reports  that appetite has been stable. He reports that he had intentionally lost some weight prior to Thanksgiving. Denies SI.   Daughter is expecting a baby girl. He reports upcoming disability hearing and states he is "nervous" about this. Reports that youngest daughter moved in with him around Thanksgiving and that this has been helpful.    Review of Systems:  Review of Systems  HENT: Positive for congestion.   Musculoskeletal: Positive for back pain. Negative for gait problem.       Reports that back recently went out and had injection recently and started on prednisone.   Neurological: Negative for tremors.  Psychiatric/Behavioral:       Please refer to HPI    Medications: I have reviewed the patient's current medications.  Current Outpatient Medications  Medication Sig Dispense Refill  . acetaminophen (TYLENOL) 325 MG tablet Take 650 mg by mouth every 6 (six) hours as needed.    . busPIRone (BUSPAR) 15 MG tablet Take 1 tablet (15 mg total) by mouth 2 (two) times daily. 180 tablet 1  . doxazosin (CARDURA) 4 MG tablet Take 1 tablet (4 mg total) by mouth at bedtime. 90 tablet 0  . DULoxetine (CYMBALTA) 30 MG capsule TAKE 1 CAPSULE BY MOUTH ONCE DAILY IN THE MORNING WITH 60MG  CAPSULE TO EQUAL 90MG   0  . DULoxetine (CYMBALTA) 60 MG capsule TAKE 1 CAPSULE BY MOUTH IN THE MORNING WITH 30 MG CAPSULE TO EQUAL 90 MG 90 capsule 0  . esomeprazole (NEXIUM) 40 MG capsule Take  40 mg by mouth daily at 12 noon.    Marland Kitchen. ibuprofen (ADVIL,MOTRIN) 400 MG tablet Take 400 mg by mouth every 6 (six) hours as needed.    . meclizine (ANTIVERT) 25 MG tablet Take 25 mg by mouth 3 (three) times daily as needed for dizziness.    Marland Kitchen. olmesartan-hydrochlorothiazide (BENICAR HCT) 40-25 MG per tablet Take 1 tablet by mouth daily.    . propranolol (INDERAL) 10 MG tablet Take 1-2 tabs po BID prn anxiety 120 tablet 2  . HYDROcodone-acetaminophen (NORCO) 10-325 MG per tablet Take 1-2 tablets by mouth every 6 (six) hours as needed.  (Patient not taking: Reported on 04/28/2018) 90 tablet 0  . methocarbamol (ROBAXIN) 500 MG tablet Take 1 tablet (500 mg total) by mouth every 6 (six) hours as needed for muscle spasms. (Patient not taking: Reported on 04/28/2018) 60 tablet 1  . tiZANidine (ZANAFLEX) 4 MG tablet Take 4 mg by mouth at bedtime.     No current facility-administered medications for this visit.     Medication Side Effects: None  Allergies:  Allergies  Allergen Reactions  . Percocet [Oxycodone-Acetaminophen]     Sick to stomach    Past Medical History:  Diagnosis Date  . Arthritis    back   . Chronic back pain    HNP and stenosis  . Depression   . GERD (gastroesophageal reflux disease)    takes Nexium daily  . Hyperlipidemia    wsa on meds but hasn't been on any in 2-3 months  . Hypertension    takes Benicare HCT daily  . Muscle spasm    takes Zanaflex nightly  . Sleep apnea    [place] [phone] [place] but doesn't have any idea where  . Umbilical hernia   . Weakness    numbness and tingling    Family History  Problem Relation Age of Onset  . Anxiety disorder Paternal Uncle   . Depression Paternal Uncle   . Post-traumatic stress disorder Father        PTSD after [place]. [employer] and then [employer] for combined [employer] 20 years    Social History   Socioeconomic History  . Marital status: Single    Spouse name: Not on file  . Number of children: Not on file  . Years of education: Not on file  . Highest education level: Not on file  Occupational History  . Not on file  Social Needs  . Financial resource strain: Not on file  . Food insecurity:    Worry: Not on file    Inability: Not on file  . Transportation needs:    Medical: Not on file    Non-medical: Not on file  Tobacco Use  . Smoking status: Never Smoker  . Smokeless tobacco: Never Used  Substance and Sexual Activity  . Alcohol use: Yes    Comment: occasionally  . Drug use: No  . Sexual activity: Not  on file  Lifestyle  . Physical activity:    Days per week: Not on file    Minutes per session: Not on file  . Stress: Not on file  Relationships  . Social connections:    Talks on phone: Not on file    Gets together: Not on file    Attends religious service: Not on file    Active member of club or organization: Not on file    Attends meetings of clubs or organizations: Not on file    Relationship status:  Not on file  . Intimate partner violence:    Fear of current or ex partner: Not on file    Emotionally abused: Not on file    Physically abused: Not on file    Forced sexual activity: Not on file  Other Topics Concern  . Not on file  Social History Narrative  . Not on file    Past Medical History, Surgical history, Social history, and Family history were reviewed and updated as appropriate.   Please see review of systems for further details on the patient's review from today.   Objective:   Physical Exam:  BP (!) 146/79   Pulse 93   Physical Exam Constitutional:      General: He is not in acute distress.    Appearance: He is well-developed.  Musculoskeletal:        General: No deformity.     Comments: Patient standing up to pace periodically on exam due to back pain.  Patient leaning over at times and holding his back.  Neurological:     Mental Status: He is alert and oriented to person, place, and time.     Coordination: Coordination normal.  Psychiatric:        Mood and Affect: Mood is anxious and depressed. Affect is not labile, blunt, angry or inappropriate.        Speech: Speech normal.        Thought Content: Thought content normal. Thought content does not include homicidal or suicidal ideation. Thought content does not include homicidal or suicidal plan.        Judgment: Judgment normal.     Comments: Insight intact. No auditory or visual hallucinations. No delusions.      Lab Review:     Component Value Date/Time   NA 140 03/24/2014 1039   K 4.1  03/24/2014 1039   CL 103 03/24/2014 1039   CO2 22 03/24/2014 1039   GLUCOSE 104 (H) 03/24/2014 1039   BUN 15 03/24/2014 1039   CREATININE 0.84 03/24/2014 1039   CALCIUM 9.6 03/24/2014 1039   GFRNONAA >90 03/24/2014 1039   GFRAA >90 03/24/2014 1039       Component Value Date/Time   WBC 5.2 03/24/2014 1039   RBC 5.54 03/24/2014 1039   HGB 16.7 03/24/2014 1039   HCT 46.6 03/24/2014 1039   PLT 161 03/24/2014 1039   MCV 84.1 03/24/2014 1039   MCH 30.1 03/24/2014 1039   MCHC 35.8 03/24/2014 1039   RDW 12.8 03/24/2014 1039    No results found for: POCLITH, LITHIUM   No results found for: PHENYTOIN, PHENOBARB, VALPROATE, CBMZ   .res Assessment: Plan:   Continue Cymbalta 90 mg daily for depression and anxiety Continue BuSpar 15 mg twice daily for anxiety. Continue doxazosin 4 mg at bedtime for nightmares.  Will change prescription from 2 mg tablets to 4 mg tablets since patient reports that he has been consistently taking two 2 mg tablets. Continue propanolol as needed for anxiety. Recommend continuing to [facility].  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  Future Appointments  Date Time Provider Department Center  08/27/2018 10:00 AM [name] Session, LPC [facility] None  10/28/2018 10:00 AM Corie Chiquito, PMHNP [facility] None    No orders of the defined types were placed in this encounter.     -------------------------------

## 2018-07-29 NOTE — Progress Notes (Signed)
      [facility] Counselor/Therapist Progress Note   Patient ID: Corey Chen, MRN: [id number]  Date: 07/29/2018  Timespent:  54 minutes  Treatment Type: Individual therapy  Subjective: Patient arrived on time for today's session.  Discussed progress.  He stated he had a pleasant Christmas and New Year's holidays, sharing experiences with his daughters.  He shared how he had reconciled with 1 of his daughters as they had an argument a few weeks ago that was discussed last session.  He shared how he took steps to proactively work on his communication and now they have been able to further discuss the issues and are doing well.  He shared how it is helped his mood somewhat as he is close to his daughters and they are part of his support system.  He shared how he continues to cope with significant pain levels, had an injection yesterday to assist with back pain and is taking pain medication as prescribed.  Patient noticeably had to readjust several times during the session to sit comfortably.  He shared other related issues that contribute to his depression, namely how he was affected by his ex-wife's behaviors leading up to their eventual divorce.  He identified how he is continuing to try cope and manage with the significant life changes he has experienced over the last few years including the loss of his father.  Ways to continue to cope and care for himself were explored and reviewed.  He plans to follow through on some daily interests.  Interventions:CBT and Strength-based  Mental Status Exam:   Appearance:   Casual     Behavior:  Appropriate  Motor:  Restlestness  Speech/Language:   Clear and Coherent and Pressured  Affect:  constricted  Mood:  anxious and depressed  Thought process:  Coherent and Relevant  Thought content:    Denies SI/HI, no AVH   Perceptual disturbances:    none  Orientation:  Full (Time, Place, and Person)  Attention:  Good  Concentration:  good  Memory:   Immediate  Fund of knowledge:   Good  Insight:    good  Judgment:   Good  Impulse Control:  good    Reported Symptoms:  Daily depressed mood, anxiety, isolative bx, sleep disturbance, anhedonia, withdrawn, fatigue  Risk Assessment: Danger to Self:  No Self-injurious Behavior: No Danger to Others: No Duty to Warn:no Physical Aggression / Violence:none Access to Firearms a concern: No  Gang Involvement:No   Diagnosis:   ICD-10-CM   1. Major depressive disorder, recurrent episode, moderate (HCC) F33.1      Plan:  1.  Patient to continue to engage in individual counseling 2-4 times a month or as needed. 2.  Patient to identify and apply CBT, coping skills learned in session to decrease depression and anxiety symptoms. 3.  Patient to decrease negative or critical self talk to increase self-acceptance. 4.  Patient to identify and process feelings of grief and loss with increasing acceptance. 5.  Patient to decrease symptoms associated with past trauma. 6.  Patient to contact this office, go to the local ED or call 911 if a crisis or emergency develops between visits.  Corey Chen Session, [facility]

## 2018-08-27 ENCOUNTER — Ambulatory Visit (INDEPENDENT_AMBULATORY_CARE_PROVIDER_SITE_OTHER): Payer: Self-pay | Admitting: Mental Health

## 2018-08-27 DIAGNOSIS — F331 Major depressive disorder, recurrent, moderate: Secondary | ICD-10-CM

## 2018-08-27 NOTE — Progress Notes (Signed)
      [facility] Counselor/Therapist Progress Note   Patient ID: Corey Chen, MRN: [id number]  Date: 09/15/2018  Timespent:  54 minutes  Treatment Type: Individual therapy  Subjective: Patient arrived on time for today's session.  Discussed progress. He stated he has been coping w/ severe back pain. He has gone to the doctor multiple times, he plans to go to physical therapy as recommended. His doctor stated that the head of one of the screws in his back has possibly come off.   Interventions:CBT and Strength-based  Mental Status Exam:   Appearance:   Casual     Behavior:  Appropriate  Motor:  Restlestness  Speech/Language:   Clear and Coherent and Pressured  Affect:  constricted  Mood:  anxious and depressed  Thought process:  Coherent and Relevant  Thought content:    Denies SI/HI, no AVH   Perceptual disturbances:    none  Orientation:  Full (Time, Place, and Person)  Attention:  Good  Concentration:  good  Memory:  Immediate  Fund of knowledge:   Good  Insight:    good  Judgment:   Good  Impulse Control:  good    Reported Symptoms:  Daily depressed mood, anxiety, isolative bx, sleep disturbance, anhedonia, withdrawn, fatigue  Risk Assessment: Danger to Self:  No Self-injurious Behavior: No Danger to Others: No Duty to Warn:no Physical Aggression / Violence:none Access to Firearms a concern: No  Gang Involvement:No    Subjective: Patient arrived on time for today's session in distress.  He shared how he continues to cope with medical issues, his chronic pain and discomfort.  He shared his history with struggling to get needed medical attention at times, the ongoing financial stress he copes with as a result of his medical diagnosis.  Explored family relationships.  He stated that he and his daughters continue to have a close relationship overall sharing some details and recent experiences with each.  Patient continues to struggle with his significant life  adjustments and around his medical issues that have significantly affected some core beliefs centered around his ability to care for himself, his family and the impact this had on other relationships.  Patient was encouraged to identify potential new believes to consider integrating into his life.  Patient was encouraged to identify strengths in this process between sessions.   Interventions:  CBT, Supportive therapy  Diagnosis:   ICD-10-CM   1. Major depressive disorder, recurrent episode, moderate (HCC) F33.1      Plan:  1.  Patient to continue to engage in individual counseling 2-4 times a month or as needed. 2.  Patient to identify and apply CBT, coping skills learned in session to decrease depression and anxiety symptoms. 3.  Patient to decrease negative or critical self talk to increase self-acceptance. 4.  Patient to identify and process feelings of grief and loss with increasing acceptance. 5.  Patient to decrease symptoms associated with past trauma. 6.  Patient to contact this office, go to the local ED or call 911 if a crisis or emergency develops between visits.  [name] Session, [facility]

## 2018-09-24 ENCOUNTER — Ambulatory Visit: Payer: Self-pay | Admitting: Mental Health

## 2018-09-24 DIAGNOSIS — F331 Major depressive disorder, recurrent, moderate: Secondary | ICD-10-CM

## 2018-09-24 NOTE — Progress Notes (Signed)
[facility] Counselor/Therapist Progress Note   Patient ID: Corey Chen, MRN: [id number]  Date: 09/24/2018  Timespent:  59 minutes  Treatment Type: Individual therapy  Interventions:CBT and Strength-based  Mental Status Exam:   Appearance:   Casual     Behavior:  Appropriate  Motor:  Restlestness  Speech/Language:   Clear and Coherent and Pressured  Affect:  constricted  Mood:  anxious and depressed  Thought process:  Coherent and Relevant  Thought content:    Denies SI/HI, no AVH   Perceptual disturbances:    none  Orientation:  Full (Time, Place, and Person)  Attention:  Good  Concentration:  good  Memory:  Immediate  Fund of knowledge:   Good  Insight:    fair  Judgment:   Good  Impulse Control:  good    Reported Symptoms:  Daily depressed mood, anxiety, isolative bx, sleep disturbance, anhedonia, withdrawn, fatigue  Risk Assessment: Danger to Self:  No Self-injurious Behavior: No Danger to Others: No Duty to Warn:no Physical Aggression / Violence:none Access to Firearms a concern: No  Gang Involvement:No    Subjective: Patient arrived on time for today's session in distress. Patient stated he was denied SSDI. Patient reviewed his history of work. He is worried financially as he is on short term disability until September. He is dating someone for the past 2-3 months. He was able to engage in identifying some identifying some strengths.  Shared how he has been dating someone recently and is hopeful about this relationship.  He is able to describe specifically needs he has in relationships and how thus far, these are being met.  Patient was able to identify and process aspects of himself in which is girlfriend sees in him that are positive as he continues to redefine his core beliefs.  Patient was encouraged to remind himself of these aspects between sessions.   Interventions:  CBT, Supportive therapy  Diagnosis:   ICD-10-CM   1. Major depressive disorder,  recurrent episode, moderate (HCC) F33.1      Plan:  1.  Patient to continue to engage in individual counseling 2-4 times a month or as needed. 2.  Patient to identify and apply CBT, coping skills learned in session to decrease depression and anxiety symptoms. 3.  Patient to decrease negative or critical self talk to increase self-acceptance. 4.  Patient to identify and process feelings of grief and loss with increasing acceptance. 5.  Patient to decrease symptoms associated with past trauma. 6.  Patient to contact this office, go to the local ED or call 911 if a crisis or emergency develops between visits.  Anson [place], [facility]

## 2018-10-20 ENCOUNTER — Ambulatory Visit (INDEPENDENT_AMBULATORY_CARE_PROVIDER_SITE_OTHER): Payer: Self-pay | Admitting: Mental Health

## 2018-10-20 ENCOUNTER — Other Ambulatory Visit: Payer: Self-pay

## 2018-10-20 DIAGNOSIS — F331 Major depressive disorder, recurrent, moderate: Secondary | ICD-10-CM

## 2018-10-27 ENCOUNTER — Ambulatory Visit (INDEPENDENT_AMBULATORY_CARE_PROVIDER_SITE_OTHER): Payer: Self-pay | Admitting: Mental Health

## 2018-10-27 ENCOUNTER — Encounter: Payer: Self-pay | Admitting: Mental Health

## 2018-10-27 ENCOUNTER — Other Ambulatory Visit: Payer: Self-pay

## 2018-10-27 DIAGNOSIS — F331 Major depressive disorder, recurrent, moderate: Secondary | ICD-10-CM

## 2018-10-27 NOTE — Progress Notes (Signed)
[facility] Counselor/Therapist Progress Note   Patient ID: Corey Chen, MRN: [id number]  Date: 10/27/2018  Timespent:  59 minutes  Treatment Type: Individual therapy  Interventions:CBT and Strength-based  Mental Status Exam:   Appearance:   UTA -unable to assess  Behavior:  UTA  Motor:  UTA  Speech/Language:   Clear and Coherent and Pressured  Affect:  UTA  Mood:  anxious and depressed  Thought process:  Coherent and Relevant  Thought content:    Denies SI/HI, no AVH   Perceptual disturbances:    none  Orientation:  Full (Time, Place, and Person)  Attention:  Good  Concentration:  good  Memory:  Immediate  Fund of knowledge:   Good  Insight:    fair  Judgment:   good  Impulse Control:  good    Reported Symptoms:  Daily depressed mood, anxiety, isolative bx, sleep disturbance, anhedonia, withdrawn, fatigue  Risk Assessment: Danger to Self:  No Self-injurious Behavior: No Danger to Others: No Duty to Warn:no Physical Aggression / Violence:none Access to Firearms a concern: No  Gang Involvement:No  Patient / guardian was educated about steps to take if suicide or homicide risk level increases between visits. While future psychiatric events cannot be accurately predicted, the patient does not currently require acute inpatient psychiatric care and does not currently meet [facility] involuntary commitment criteria.  Subjective: Patient engaged in tele-therapy session via telephone.  He shared progress, recent events.  He shared specifically how he is coping with a recent viral outbreak, as it affected him and his family.  He stated that he he has not had to cope with any one in his family have the virus, however he is taking appropriate precautions.  He continues to try and make efforts to be social, spend time with family members, however consistently copes with daily depressed mood and anxiety which impacts his motivation.  He continues to reside with his mother as  he continues to cope with ongoing financial stress.  Worries most days, rumination.  Reports taking his medications as prescribed, reminded him of his coming medication management appointment.  Continue to work with him from a cognitive behavioral framework, reviewed coping and exploring alternative coping.  Reviewed diaphragmatic breathing and mindfulness exercises to engage in between sessions. Interventions:  CBT, Supportive therapy  Virtual Visit via Telephone Note I connected with patient by a video enabled telemedicine application or telephone, with their informed consent, and verified patient privacy and that I am speaking with the correct person using two identifiers.    I discussed the limitations, risks, security and privacy concerns of performing psychotherapy and management service by telephone and the availability of in person appointments. I also discussed with the patient that there may be a patient responsible charge related to this service. The patient expressed understanding and agreed to proceed.  I discussed the treatment planning with the patient. The patient was provided an opportunity to ask questions and all were answered. The patient agreed with the plan and demonstrated an understanding of the instructions.   The patient was advised to call  our office if  symptoms worsen or feel they are in a crisis state and need immediate contact.   Diagnosis:   ICD-10-CM   1. Major depressive disorder, recurrent episode, moderate (HCC) F33.1      Plan:  1.  Patient to continue to engage in individual counseling 2-4 times a month or as needed. 2.  Patient to identify and apply CBT,  coping skills learned in session to decrease depression and anxiety symptoms. 3.  Patient to decrease negative or critical self talk to increase self-acceptance. 4.  Patient to identify and process feelings of grief and loss with increasing acceptance. 5.  Patient to decrease symptoms associated with  past trauma. 6.  Patient to contact this office, go to the local ED or call 911 if a crisis or emergency develops between visits.  [name] Sessionhristopher Azzan Butler, [facility]

## 2018-10-28 ENCOUNTER — Ambulatory Visit: Payer: Self-pay | Admitting: Psychiatry

## 2018-11-03 ENCOUNTER — Other Ambulatory Visit: Payer: Self-pay | Admitting: Psychiatry

## 2018-11-03 NOTE — Progress Notes (Signed)
[facility] Counselor/Therapist Progress Note   Patient ID: Corey Chen, MRN: [id number]  Date: 10/20/18  Timespent:  45 minutes  Treatment Type: Individual therapy  Interventions:CBT and Strength-based  Mental Status Exam:   Appearance:   Casual     Behavior:  Appropriate  Motor:  Restlestness  Speech/Language:   Clear and Coherent and Pressured  Affect:  constricted  Mood:  anxious and depressed  Thought process:  Coherent and Relevant  Thought content:    Denies SI/HI, no AVH   Perceptual disturbances:    none  Orientation:  Full (Time, Place, and Person)  Attention:  Good  Concentration:  good  Memory:  Immediate  Fund of knowledge:   Good  Insight:    fair  Judgment:   Good  Impulse Control:  good    Reported Symptoms:  Daily depressed mood, anxiety, isolative bx, sleep disturbance, anhedonia, withdrawn, fatigue  Risk Assessment: Danger to Self:  No Self-injurious Behavior: No Danger to Others: No Duty to Warn:no Physical Aggression / Violence:none Access to Firearms a concern: No  Gang Involvement:No    Subjective: Patient engaged in tele-therapy session.  Shared progress, recent events since her last visit.  He continues to live with his mother, continues to have significant financial stress.  Has support from family but continues to struggle with being unable to work and on enough income to maintain at this point.  Without his family, his situation would be more dire.  Shared some recent meaningful experiences with his girlfriend who is highly supportive of him.  Provided support as he processed feelings related to not being granted disability status recently through the courts.  He stated he plans to resubmit his request.  Challenged him on some negative self talk and encouraged him to continue between sessions.  Virtual Visit via Telephone Note I connected with patient by a video enabled telemedicine application or telephone, with their informed  consent, and verified patient privacy and that I am speaking with the correct person using two identifiers. I discussed the limitations, risks, security and privacy concerns of performing psychotherapy and management service by telephone and the availability of in person appointments. I also discussed with the patient that there may be a patient responsible charge related to this service. The patient expressed understanding and agreed to proceed. I discussed the treatment planning with the patient. The patient was provided an opportunity to ask questions and all were answered. The patient agreed with the plan and demonstrated an understanding of the instructions. The patient was advised to call  our office if  symptoms worsen or feel they are in a crisis state and need immediate contact. Interventions:  CBT, Supportive therapy  Diagnosis:   ICD-10-CM   1. Major depressive disorder, recurrent episode, moderate (HCC) F33.1      Plan:  1.  Patient to continue to engage in individual counseling 2-4 times a month or as needed. 2.  Patient to identify and apply CBT, coping skills learned in session to decrease depression and anxiety symptoms. 3.  Patient to decrease negative or critical self talk to increase self-acceptance. 4.  Patient to identify and process feelings of grief and loss with increasing acceptance. 5.  Patient to decrease symptoms associated with past trauma. 6.  Patient to contact this office, go to the local ED or call 911 if a crisis or emergency develops between visits.  [name] Session, [facility]

## 2018-11-20 ENCOUNTER — Encounter: Payer: Self-pay | Admitting: Psychiatry

## 2018-11-20 ENCOUNTER — Other Ambulatory Visit: Payer: Self-pay

## 2018-11-20 ENCOUNTER — Ambulatory Visit (INDEPENDENT_AMBULATORY_CARE_PROVIDER_SITE_OTHER): Payer: Self-pay | Admitting: Psychiatry

## 2018-11-20 DIAGNOSIS — F332 Major depressive disorder, recurrent severe without psychotic features: Secondary | ICD-10-CM

## 2018-11-20 DIAGNOSIS — F5105 Insomnia due to other mental disorder: Secondary | ICD-10-CM

## 2018-11-20 DIAGNOSIS — F4310 Post-traumatic stress disorder, unspecified: Secondary | ICD-10-CM

## 2018-11-20 MED ORDER — DULOXETINE HCL 60 MG PO CPEP: ORAL_CAPSULE | ORAL | 1 refills | Status: DC

## 2018-11-20 MED ORDER — DULOXETINE HCL 30 MG PO CPEP: ORAL_CAPSULE | ORAL | 1 refills | Status: DC

## 2018-11-20 MED ORDER — DOXAZOSIN MESYLATE 4 MG PO TABS: 4.0000 mg | ORAL_TABLET | Freq: Every day | ORAL | 0 refills | Status: DC

## 2018-11-20 MED ORDER — PROPRANOLOL HCL 10 MG PO TABS: ORAL_TABLET | ORAL | 2 refills | Status: DC

## 2018-11-20 MED ORDER — BUSPIRONE HCL 15 MG PO TABS: 15.0000 mg | ORAL_TABLET | Freq: Two times a day (BID) | ORAL | 1 refills | Status: DC

## 2018-11-20 NOTE — Progress Notes (Signed)
[name] [id number] 09-17-1970 48 y.o.  Virtual Visit via Telephone Note  I connected with@ on 11/20/18 at  9:30 AM EDT by telephone and verified that I am speaking with the correct person using two identifiers.   I discussed the limitations, risks, security and privacy concerns of performing an evaluation and management service by telephone and the availability of in person appointments. I also discussed with the patient that there may be a patient responsible charge related to this service. The patient expressed understanding and agreed to proceed.   I discussed the assessment and treatment plan with the patient. The patient was provided an opportunity to ask questions and all were answered. The patient agreed with the plan and demonstrated an understanding of the instructions.   The patient was advised to call back or seek an in-person evaluation if the symptoms worsen or if the condition fails to improve as anticipated.  I provided 30 minutes of non-face-to-face time during this encounter.  The patient was located at home.  The provider was located at home.   Corie Chiquito, PMHNP   Subjective:   Patient ID:  Corey Chen is a 49 y.o. (DOB 07-12-1971) male.  Chief Complaint:  Chief Complaint  Patient presents with  . Anxiety  . Depression  . Sleeping Problem    HPI Corey Chen presents for follow-up of anxiety, depression, and sleep disturbance.  "Everything seems like it is overwhelming me again." He reports that he has some family stressors and rumination related to this. He reports frequent worry. Describes some possible intrusive thoughts. Reports occ intrusive memories "but not as bad as it was." He reports that often his activity is limited due to back pain and that he has more negative thoughts and anxiety "when I am just laying there" and unable to distract himself.   Reports depressed mood. Reports that he had more irritability last week and has been trying to  handle this better. Reports that his energy and motivation has been low. He reports that he needs to take care of a home improvement issue and has been unable to make the phone calls to initiate the project. Reports that he avoids social interaction. He reports some difficulty falling asleep due to worry and negative thoughts. Reports that he is typically able to stay asleep once he is able to fall asleep. Reports that he has been having frequent dreams and that dreams are less distressing compared to the past. Reports taking Doxazosin prn.  Appetite has been increased. He reports poor concentration- "if I don't have it in my hand and doing it then, I will forget about it... I don't want to deal with it." Reports that he is late paying bills at times and this is unlike him. Denies SI- "but if something were to happen to me I would be alright with that."  Reports that he has tried to CBD oil to help with chronic pain.    Review of Systems:  Review of Systems  Musculoskeletal: Positive for back pain. Negative for gait problem.       Reports recent ankle and knee pain. "I ache all the time."  Neurological: Negative for tremors.  Psychiatric/Behavioral:       Please refer to HPI    Medications: I have reviewed the patient's current medications.  Current Outpatient Medications  Medication Sig Dispense Refill  . acetaminophen (TYLENOL) 325 MG tablet Take 650 mg by mouth every 6 (six) hours as needed.    . DULoxetine (CYMBALTA)  30 MG capsule TAKE 1 CAPSULE BY MOUTH ONCE DAILY IN THE MORNING WITH  CAPSULE TO EQUAL  90 capsule 1  . DULoxetine (CYMBALTA) 60 MG capsule TAKE 1 CAPSULE BY MOUTH IN THE MORNING WITH 30 MG CAPSULE TO EQUAL 90 MG 90 capsule 1  . esomeprazole (NEXIUM) 40 MG capsule Take 40 mg by mouth daily at 12 noon.    Marland Kitchen ibuprofen (ADVIL,MOTRIN) 400 MG tablet Take 400 mg by mouth every 6 (six) hours as needed.    . meclizine (ANTIVERT) 25 MG tablet Take 25 mg by mouth 3 (three) times  daily as needed for dizziness.    Marland Kitchen olmesartan-hydrochlorothiazide (BENICAR HCT) 40-25 MG per tablet Take 1 tablet by mouth daily.    . propranolol (INDERAL) 10 MG tablet Take 1-2 tabs po BID prn anxiety 120 tablet 2  . busPIRone (BUSPAR) 15 MG tablet Take 1 tablet (15 mg total) by mouth 2 (two) times daily. 180 tablet 1  . doxazosin (CARDURA) 4 MG tablet Take 1 tablet (4 mg total) by mouth at bedtime. 90 tablet 0  . tiZANidine (ZANAFLEX) 4 MG tablet Take 4 mg by mouth at bedtime.     No current facility-administered medications for this visit.     Medication Side Effects: Other: Reports some discontinuation s/s with missed doses of medications.   Allergies:  Allergies  Allergen Reactions  . Percocet [Oxycodone-Acetaminophen]     Sick to stomach    Past Medical History:  Diagnosis Date  . Arthritis    back   . Chronic back pain    HNP and stenosis  . Depression   . GERD (gastroesophageal reflux disease)    takes Nexium daily  . Hyperlipidemia    wsa on meds but hasn't been on any in 2-3 months  . Hypertension    takes Benicare HCT daily  . Muscle spasm    takes Zanaflex nightly  . Sleep apnea    sleep study done 5-80yrs ago in [place] but doesn't have any idea where  . Umbilical hernia   . Weakness    numbness and tingling    Family History  Problem Relation Age of Onset  . Anxiety disorder Paternal Uncle   . Depression Paternal Uncle   . Post-traumatic stress disorder Father        PTSD after [place]. 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 Marital status: Single    Spouse name: Not on file  . Number of children: Not on file  . Years of education: Not on file  . Highest education level: Not on file  Occupational History  . Not on file  Social Needs  . Financial resource strain: Not on file  . Food insecurity:    Worry: Not on file    Inability: Not on file  . Transportation needs:     Medical: Not on file    Non-medical: Not on file  Tobacco Use  . Smoking status: Never Smoker  . Smokeless tobacco: Never Used  Substance and Sexual Activity  . Alcohol use: Yes    Comment: occasionally  . Drug use: No  . Sexual activity: Not on file  Lifestyle  . Physical activity:    Days per week: Not on file    Minutes per session: Not on file  . Stress: Not on file  Relationships  . Social connections:    Talks on phone: Not on  file    Gets together: Not on file    Attends religious service: Not on file    Active member of club or organization: Not on file    Attends meetings of clubs or organizations: Not on file    Relationship status: Not on file  . Intimate partner violence:    Fear of current or ex partner: Not on file    Emotionally abused: Not on file    Physically abused: Not on file    Forced sexual activity: Not on file  Other Topics Concern  . Not on file  Social History Narrative  . Not on file    Past Medical History, Surgical history, Social history, and Family history were reviewed and updated as appropriate.   Please see review of systems for further details on the patient's review from today.   Objective:   Physical Exam:  There were no vitals taken for this visit.  Physical Exam Neurological:     Mental Status: He is alert and oriented to person, place, and time.     Cranial Nerves: No dysarthria.  Psychiatric:        Attention and Perception: Attention normal.        Mood and Affect: Mood is anxious and depressed.        Speech: Speech normal.        Behavior: Behavior is cooperative.        Thought Content: Thought content normal. Thought content is not paranoid or delusional. Thought content does not include homicidal or suicidal ideation. Thought content does not include homicidal or suicidal plan.        Cognition and Memory: Cognition and memory normal.        Judgment: Judgment normal.     Lab Review:     Component Value  Date/Time   NA 140 03/24/2014 1039   K 4.1 03/24/2014 1039   CL 103 03/24/2014 1039   CO2 22 03/24/2014 1039   GLUCOSE 104 (H) 03/24/2014 1039   BUN 15 03/24/2014 1039   CREATININE 0.84 03/24/2014 1039   CALCIUM 9.6 03/24/2014 1039   GFRNONAA >90 03/24/2014 1039   GFRAA >90 03/24/2014 1039       Component Value Date/Time   WBC 5.2 03/24/2014 1039   RBC 5.54 03/24/2014 1039   HGB 16.7 03/24/2014 1039   HCT 46.6 03/24/2014 1039   PLT 161 03/24/2014 1039   MCV 84.1 03/24/2014 1039   MCH 30.1 03/24/2014 1039   MCHC 35.8 03/24/2014 1039   RDW 12.8 03/24/2014 1039    No results found for: POCLITH, LITHIUM   No results found for: PHENYTOIN, PHENOBARB, VALPROATE, CBMZ   .res Assessment: Plan:   Discussed treatment options with patient to include augmentation strategies, such as restarting Rexulti or considering an alternative medication that may be more affordable, such as Abilify.  Patient reports that he would prefer not to add additional medication at this time since he is currently taking multiple medications.  Agreed with plan for patient to continue to address past traumatic events and mood and anxiety signs and symptoms in therapy with Elio Forgethris Andrews, LPC. Continue Cymbalta 90 mg daily for depression and anxiety. Continue BuSpar 15 mg twice daily for anxiety. Continue propanolol as needed for anxiety. Continue doxazosin at bedtime as needed for nightmares. Patient to follow-up with this provider in 2 months or sooner if clinically indicated. Patient advised to contact office with any questions, adverse effects, or acute worsening in signs and symptoms.  PTSD (post-traumatic stress disorder) - Plan: busPIRone (BUSPAR) 15 MG tablet, DULoxetine (CYMBALTA) 30 MG capsule, DULoxetine (CYMBALTA) 60 MG capsule, propranolol (INDERAL) 10 MG tablet, doxazosin (CARDURA) 4 MG tablet  Severe episode of recurrent major depressive disorder, without psychotic features (HCC) - Plan: DULoxetine  (CYMBALTA) 30 MG capsule, DULoxetine (CYMBALTA) 60 MG capsule  Insomnia due to mental disorder - Plan: doxazosin (CARDURA) 4 MG tablet  Please see After Visit Summary for patient specific instructions.  Future Appointments  Date Time Provider Department Center  12/09/2018  9:00 AM [name] Session, [facility] None  01/20/2019 10:00 AM Corie Chiquito, PMHNP [facility] None    No orders of the defined types were placed in this encounter.     -------------------------------

## 2018-12-09 ENCOUNTER — Ambulatory Visit (INDEPENDENT_AMBULATORY_CARE_PROVIDER_SITE_OTHER): Payer: Self-pay | Admitting: Mental Health

## 2018-12-09 ENCOUNTER — Other Ambulatory Visit: Payer: Self-pay

## 2018-12-09 DIAGNOSIS — F332 Major depressive disorder, recurrent severe without psychotic features: Secondary | ICD-10-CM

## 2018-12-09 NOTE — Progress Notes (Signed)
[facility] Counselor/Therapist Progress Note   Patient ID: Corey Chen, MRN: [id number]  Date: 12/09/2018  Timespent:  46 minutes  Treatment Type: Individual therapy  Virtual Visit via Telephone Note Connected with patient by a video enabled telemedicine/telehealth application or telephone, with their informed consent, and verified patient privacy and that I am speaking with the correct person using two identifiers. I discussed the limitations, risks, security and privacy concerns of performing psychotherapy and management service by telephone and the availability of in person appointments. I also discussed with the patient that there may be a patient responsible charge related to this service. The patient expressed understanding and agreed to proceed. I discussed the treatment planning with the patient. The patient was provided an opportunity to ask questions and all were answered. The patient agreed with the plan and demonstrated an understanding of the instructions. The patient was advised to call  our office if  symptoms worsen or feel they are in a crisis state and need immediate contact.   Therapist Location: home Patient Location: hom   Mental Status Exam:   Appearance:   UTA -unable to assess  Behavior:  UTA  Motor:  UTA  Speech/Language:   Clear and Coherent and Pressured  Affect:  UTA  Mood:  anxious and depressed  Thought process:  Coherent and Relevant  Thought content:    Denies SI/HI, no AVH   Perceptual disturbances:    none  Orientation:  Full (Time, Place, and Person)  Attention:  Good  Concentration:  good  Memory:  Immediate  Fund of knowledge:   Good  Insight:    fair  Judgment:   good  Impulse Control:  good    Reported Symptoms:  Daily depressed mood, anxiety, isolative bx, sleep disturbance, anhedonia, withdrawn, fatigue  Risk Assessment: Danger to Self:  No Self-injurious Behavior: No Danger to Others: No Duty to Warn:no Physical  Aggression / Violence:none Access to Firearms a concern: No  Gang Involvement:No  Patient / guardian was educated about steps to take if suicide or homicide risk level increases between visits. While future psychiatric events cannot be accurately predicted, the patient does not currently require acute inpatient psychiatric care and does not currently meet [facility] involuntary commitment criteria.  Subjective: Patient engaged in tele-therapy session via telephone.  Discussed progress.  Patient stated that he continues to cope with significant financial stress and has learned that he will lose his short-term disability benefits soon.  He continues to await disability determination although he had a recent denial.  Patient is considering trying to find employment due to his situation however, struggles to have confidence in his ability due to his medical conditions.  Provide support.  Continue to explore the patient personal strengths.  Continue to work with patient from a cognitive behavioral, strength-based approach.  Provide support and understanding.  Encouraged patient to continue coping efforts discussed during session.  He continues to have a support system including family and friends.  Interventions:  CBT, Supportive therapy   Diagnosis:   ICD-10-CM   1. Severe episode of recurrent major depressive disorder, without psychotic features (HCC) F33.2      Plan:  1.  Patient to continue to engage in individual counseling 2-4 times a month or as needed. 2.  Patient to identify and apply CBT, coping skills learned in session to decrease depression and anxiety symptoms. 3.  Patient to decrease negative or critical self talk to increase self-acceptance. 4.  Patient to identify  and process feelings of grief and loss with increasing acceptance. 5.  Patient to decrease symptoms associated with past trauma. 6.  Patient to contact this office, go to the local ED or call 911 if a crisis or  emergency develops between visits.  [name] Sessionhristopher Katrell Milhorn, [facility]

## 2018-12-28 ENCOUNTER — Ambulatory Visit: Payer: Self-pay | Admitting: Mental Health

## 2019-01-07 ENCOUNTER — Telehealth: Payer: Self-pay | Admitting: Psychiatry

## 2019-01-07 NOTE — Telephone Encounter (Signed)
Returned call to Disability reviewer. Reviewed assessments and treatment from most recent visits and answered reviewers questions re: function and symptoms. Discussed that pt is currently seeing Lanetta Inch, [facility] for therapy. Discussed that return to work does not seem to be an immediate possibility for pt.

## 2019-01-18 ENCOUNTER — Ambulatory Visit (INDEPENDENT_AMBULATORY_CARE_PROVIDER_SITE_OTHER): Payer: Self-pay | Admitting: Mental Health

## 2019-01-18 ENCOUNTER — Other Ambulatory Visit: Payer: Self-pay

## 2019-01-18 ENCOUNTER — Encounter (INDEPENDENT_AMBULATORY_CARE_PROVIDER_SITE_OTHER): Payer: Self-pay

## 2019-01-18 DIAGNOSIS — F332 Major depressive disorder, recurrent severe without psychotic features: Secondary | ICD-10-CM

## 2019-01-18 NOTE — Progress Notes (Signed)
      [facility] Counselor/Therapist Progress Note   Patient ID: Corey Chen, MRN: [id number]  Date: 03/21/2019  Timespent:  54 minutes  Treatment Type: Individual therapy  Mental Status Exam:   Appearance:   casual  Behavior:  appropriate  Motor:  wnl  Speech/Language:   Clear and Coherent and Pressured  Affect:  constricted  Mood:  anxious and depressed  Thought process:  Coherent and Relevant  Thought content:    Denies SI/HI, no AVH   Perceptual disturbances:    none  Orientation:  Full (Time, Place, and Person)  Attention:  Good  Concentration:  good  Memory:  Immediate  Fund of knowledge:   Good  Insight:    fair  Judgment:   good  Impulse Control:  good    Reported Symptoms:  Daily depressed mood, anxiety, isolative bx, sleep disturbance, anhedonia, withdrawn, fatigue  Risk Assessment: Danger to Self:  No Self-injurious Behavior: No Danger to Others: No Duty to Warn:no Physical Aggression / Violence:none Access to Firearms a concern: No  Gang Involvement:No  Patient / guardian was educated about steps to take if suicide or homicide risk level increases between visits. While future psychiatric events cannot be accurately predicted, the patient does not currently require acute inpatient psychiatric care and does not currently meet [facility] involuntary commitment criteria.  Subjective: Patient engaged in session at office.  Discussed progress.  Continues to cope w/ chronic pain/medical. Continues to express worry about his future, unable to work. Shared family relationships, has some support emotionally from gf, mother and daughters. Continues to cope w/ financial stress, when and if he will be granted long term disability benefits. Assisted him in identifying self talk that increases stress, depression and reframing his thoughts to increase calming self talk between sessions. Encouraged daily efforts in this area. Continue to work with patient from a cognitive  behavioral, strength-based approach.  Interventions:  CBT, Supportive therapy   Diagnosis:   ICD-10-CM   1. Severe episode of recurrent major depressive disorder, without psychotic features ([place])  F33.2      Plan:  1.  Patient to continue to engage in individual counseling 2-4 times a month or as needed. 2.  Patient to identify and apply CBT, coping skills learned in session to decrease depression and anxiety symptoms. 3.  Patient to decrease negative or critical self talk to increase self-acceptance. 4.  Patient to identify and process feelings of grief and loss with increasing acceptance. 5.  Patient to decrease symptoms associated with past trauma. 6.  Patient to contact this office, go to the local ED or call 911 if a crisis or emergency develops between visits.  [place], [facility]

## 2019-01-20 ENCOUNTER — Ambulatory Visit: Payer: Self-pay | Admitting: Psychiatry

## 2019-02-18 ENCOUNTER — Ambulatory Visit: Payer: Self-pay | Admitting: Mental Health

## 2019-05-10 ENCOUNTER — Ambulatory Visit (INDEPENDENT_AMBULATORY_CARE_PROVIDER_SITE_OTHER): Payer: Self-pay | Admitting: Psychiatry

## 2019-05-10 ENCOUNTER — Encounter: Payer: Self-pay | Admitting: Psychiatry

## 2019-05-10 ENCOUNTER — Other Ambulatory Visit: Payer: Self-pay

## 2019-05-10 VITALS — BP 149/93 | HR 74 | Wt 264.0 lb

## 2019-05-10 DIAGNOSIS — F332 Major depressive disorder, recurrent severe without psychotic features: Secondary | ICD-10-CM

## 2019-05-10 DIAGNOSIS — F4310 Post-traumatic stress disorder, unspecified: Secondary | ICD-10-CM

## 2019-05-10 DIAGNOSIS — F5105 Insomnia due to other mental disorder: Secondary | ICD-10-CM

## 2019-05-10 MED ORDER — SERTRALINE HCL 100 MG PO TABS: 100.0000 mg | ORAL_TABLET | Freq: Every day | ORAL | 1 refills | Status: DC

## 2019-05-10 MED ORDER — SERTRALINE HCL 50 MG PO TABS: ORAL_TABLET | ORAL | 0 refills | Status: DC

## 2019-05-10 MED ORDER — PROPRANOLOL HCL 10 MG PO TABS: ORAL_TABLET | ORAL | 2 refills | Status: DC

## 2019-05-10 NOTE — Progress Notes (Signed)
Corey Chen [id number] [birth year] 48 y.o.  Subjective:   Patient ID:  Corey Chen is a 48 y.o. (DOB [birth year]) male.  Chief Complaint:  Chief Complaint  Patient presents with  . Anxiety  . Depression  . Sleeping Problem    HPI Corey Chen presents to the office today for follow-up of anxiety, depression, and insomnia. He reports low energy and low motivation. He reports, "I stay to myself" and has been socially withdrawn. He reports persistent sad Chen. Reports Chen seems to be lower when there are multiple cloudy or rainy days. He reports occasional irritability. He reports constant worry and anxious thoughts. Frequent catastrophic thinking. Reports continued intrusive memories and flashbacks related to past traumatic events. He reports continued hypervigilance and "feel nervous all the time." He reports that about once a week he will feel overwhelmed and then will take Propranolol prn to tx panic s/s. Describes anxiety in social situations and frequently worries about he is perceived by others.   He reports poor sleep overall. Reports occasional sleepless nights. Difficulty falling asleep and reports that anxious thoughts interfere with sleep initiation. Reports that he has difficulty getting up and going in the morning. He reports altered sleep wake cycle and tends to sleep more during the day. Occasional nightmares.   He reports that he has been eating excessive amounts of foods and will eat during the night when he is unable to sleep. He reports poor concentration and focus. He reports difficulty keeping track to things to include finances. He reports procrastinating and will put off paying bills. He reports that at times he will start to do something and forget about it before task is completed. Denies SI.   He reports that he has been having to limit medical appointments due to financial constraints. He reports that he "weaned" himself off of medications to determine side effects  and due to financial stressors. He reports feeling "more emotional" without medications. He reports that his thoughts are clearer without medication.   Daughter continues to live with him. He reports that she is frequently not at home due to her job.   Past Psychiatric Medication Trials: Buspar Cymbalta Lexapro- restless legs Effexor- restless legs Gabapentin Lyrica-Adverse reactions Trazodone Ambien- excessive daytime somnolence Propranolol Doxazosin Rexulti Xanax    Review of Systems:  Review of Systems  Musculoskeletal: Positive for arthralgias, back pain and myalgias. Negative for gait problem.  Neurological: Negative for tremors.       Last episode of Vertigo was one month ago. Occ dizziness.Denies RLS.  Psychiatric/Behavioral:       Please refer to HPI    Medications: I have reviewed the patient's current medications.  Current Outpatient Medications  Medication Sig Dispense Refill  . acetaminophen (TYLENOL) 325 MG tablet Take 650 mg by mouth every 6 (six) hours as needed.    Marland Kitchen. esomeprazole (NEXIUM) 40 MG capsule Take 40 mg by mouth daily at 12 noon.    Marland Kitchen. ibuprofen (ADVIL,MOTRIN) 400 MG tablet Take 400 mg by mouth every 6 (six) hours as needed.    . meclizine (ANTIVERT) 25 MG tablet Take 25 mg by mouth 3 (three) times daily as needed for dizziness.    Marland Kitchen. olmesartan-hydrochlorothiazide (BENICAR HCT) 40-25 MG per tablet Take 1 tablet by mouth daily.    . propranolol (INDERAL) 10 MG tablet Take 1-2 tabs po BID prn anxiety 120 tablet 2  . sertraline (ZOLOFT) 100 MG tablet Take 1 tablet (100 mg total) by mouth daily. 30 tablet 1  .  sertraline (ZOLOFT) 50 MG tablet Take 1/2 tab po qd x 2-4 days, then 1 tab po qd x 1 week, then start 100 mg tabs. 15 tablet 0   No current facility-administered medications for this visit.     Medication Side Effects: Other: Sexual side effects and impaired concentration with Cymbalta  Allergies:  Allergies  Allergen Reactions  . Percocet  [Oxycodone-Acetaminophen]     Sick to stomach    Past Medical History:  Diagnosis Date  . Arthritis    back   . Chronic back pain    HNP and stenosis  . Depression   . GERD (gastroesophageal reflux disease)    takes Nexium daily  . Hyperlipidemia    wsa on meds but hasn't been on any in 2-3 months  . Hypertension    takes Benicare HCT daily  . Muscle spasm    takes Zanaflex nightly  . Sleep apnea    sleep study done 5-68yrs ago in [place] but doesn't have any idea where  . Umbilical hernia   . Weakness    numbness and tingling    Family History  Problem Relation Age of Onset  . Anxiety disorder Paternal Uncle   . Depression Paternal Uncle   . Post-traumatic stress disorder Father        PTSD after [place]. 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 Marital status: Single    Spouse name: Not on file  . Number of children: Not on file  . Years of education: Not on file  . Highest education level: Not on file  Occupational History  . Not on file  Social Needs  . Financial resource strain: Not on file  . Food insecurity    Worry: Not on file    Inability: Not on file  . Transportation needs    Medical: Not on file    Non-medical: Not on file  Tobacco Use  . Smoking status: Never Smoker  . Smokeless tobacco: Never Used  Substance and Sexual Activity  . Alcohol use: Yes    Comment: occasionally  . Drug use: No  . Sexual activity: Not on file  Lifestyle  . Physical activity    Days per week: Not on file    Minutes per session: Not on file  . Stress: Not on file  Relationships  . Social Musician on phone: Not on file    Gets together: Not on file    Attends religious service: Not on file    Active member of club or organization: Not on file    Attends meetings of clubs or organizations: Not on file    Relationship status: Not on file  . Intimate partner violence    Fear of  current or ex partner: Not on file    Emotionally abused: Not on file    Physically abused: Not on file    Forced sexual activity: Not on file  Other Topics Concern  . Not on file  Social History Narrative  . Not on file    Past Medical History, Surgical history, Social history, and Family history were reviewed and updated as appropriate.   Please see review of systems for further details on the patient's review from today.   Objective:   Physical Exam:  BP (!) 149/93   Pulse 74   Wt 264 lb (119.7 kg)   BMI 36.82 kg/m  Physical Exam Constitutional:      General: He is not in acute distress.    Appearance: He is well-developed.  Musculoskeletal:        General: No deformity.  Neurological:     Mental Status: He is alert and oriented to person, place, and time.     Coordination: Coordination normal.  Psychiatric:        Attention and Perception: Attention and perception normal. He does not perceive auditory or visual hallucinations.        Chen and Affect: Chen is anxious and depressed. Affect is blunt. Affect is not labile, angry or inappropriate.        Speech: Speech normal.        Behavior: Behavior is cooperative.        Thought Content: Thought content normal. Thought content is not paranoid or delusional. Thought content does not include homicidal or suicidal ideation. Thought content does not include homicidal or suicidal plan.        Cognition and Memory: Cognition and memory normal.        Judgment: Judgment normal.     Comments: Insight intact.      Lab Review:     Component Value Date/Time   NA 140 03/24/2014 1039   K 4.1 03/24/2014 1039   CL 103 03/24/2014 1039   CO2 22 03/24/2014 1039   GLUCOSE 104 (H) 03/24/2014 1039   BUN 15 03/24/2014 1039   CREATININE 0.84 03/24/2014 1039   CALCIUM 9.6 03/24/2014 1039   GFRNONAA >90 03/24/2014 1039   GFRAA >90 03/24/2014 1039       Component Value Date/Time   WBC 5.2 03/24/2014 1039   RBC 5.54 03/24/2014  1039   HGB 16.7 03/24/2014 1039   HCT 46.6 03/24/2014 1039   PLT 161 03/24/2014 1039   MCV 84.1 03/24/2014 1039   MCH 30.1 03/24/2014 1039   MCHC 35.8 03/24/2014 1039   RDW 12.8 03/24/2014 1039    No results found for: POCLITH, LITHIUM   No results found for: PHENYTOIN, PHENOBARB, VALPROATE, CBMZ   .res Assessment: Plan:   Discussed treatment options and reviewed potential benefits, risks, and side effects of sertraline for treatment of anxiety and depression since patient reports having tolerability issues in minimal improvement with Cymbalta.  Patient agrees to trial of sertraline.  Will send to [facility], one prescription for 50 mg tabs to start titration and then prescription for 100 mg tabs to minimize tablet quantity and cost. Patient to start sertraline 25 mg daily for 2 to 4 days, then increase to 50 mg daily for 1 week, then increase to 100 mg daily for anxiety and depression. Will continue propanolol as needed for anxiety. Patient to follow-up in 6 weeks or sooner if clinically indicated. Patient advised to contact office with any questions, adverse effects, or acute worsening in signs and symptoms.  Corey Chen was seen today for anxiety, depression and sleeping problem.  Diagnoses and all orders for this visit:  Severe episode of recurrent major depressive disorder, without psychotic features (HCC) -     sertraline (ZOLOFT) 50 MG tablet; Take 1/2 tab po qd x 2-4 days, then 1 tab po qd x 1 week, then start 100 mg tabs. -     sertraline (ZOLOFT) 100 MG tablet; Take 1 tablet (100 mg total) by mouth daily.  PTSD (post-traumatic stress disorder) -     sertraline (ZOLOFT) 50 MG tablet; Take 1/2 tab po qd x 2-4 days, then 1 tab po  qd x 1 week, then start 100 mg tabs. -     propranolol (INDERAL) 10 MG tablet; Take 1-2 tabs po BID prn anxiety -     sertraline (ZOLOFT) 100 MG tablet; Take 1 tablet (100 mg total) by mouth daily.  Insomnia due to mental disorder     Please see After  Visit Summary for patient specific instructions.  Future Appointments  Date Time Provider [place]  06/22/2019 11:00 AM Thayer Headings, PMHNP [facility] None    No orders of the defined types were placed in this encounter.   -------------------------------

## 2019-06-04 ENCOUNTER — Other Ambulatory Visit: Payer: Self-pay | Admitting: Psychiatry

## 2019-06-04 DIAGNOSIS — F4310 Post-traumatic stress disorder, unspecified: Secondary | ICD-10-CM

## 2019-06-04 DIAGNOSIS — F332 Major depressive disorder, recurrent severe without psychotic features: Secondary | ICD-10-CM

## 2019-06-22 ENCOUNTER — Ambulatory Visit: Payer: Self-pay | Admitting: Psychiatry

## 2019-08-13 ENCOUNTER — Other Ambulatory Visit: Payer: Self-pay | Admitting: Psychiatry

## 2019-08-13 DIAGNOSIS — F4310 Post-traumatic stress disorder, unspecified: Secondary | ICD-10-CM

## 2019-11-04 ENCOUNTER — Other Ambulatory Visit: Payer: Self-pay

## 2019-11-04 ENCOUNTER — Encounter: Payer: Self-pay | Admitting: Psychiatry

## 2019-11-04 ENCOUNTER — Ambulatory Visit (INDEPENDENT_AMBULATORY_CARE_PROVIDER_SITE_OTHER): Payer: BLUE CROSS/BLUE SHIELD | Admitting: Psychiatry

## 2019-11-04 DIAGNOSIS — F332 Major depressive disorder, recurrent severe without psychotic features: Secondary | ICD-10-CM

## 2019-11-04 DIAGNOSIS — F4310 Post-traumatic stress disorder, unspecified: Secondary | ICD-10-CM

## 2019-11-04 MED ORDER — PROPRANOLOL HCL 10 MG PO TABS: ORAL_TABLET | ORAL | 2 refills | Status: DC

## 2019-11-04 MED ORDER — SERTRALINE HCL 100 MG PO TABS: 100.0000 mg | ORAL_TABLET | Freq: Every day | ORAL | 1 refills | Status: DC

## 2019-11-04 MED ORDER — SERTRALINE HCL 50 MG PO TABS: ORAL_TABLET | ORAL | 0 refills | Status: DC

## 2019-11-04 NOTE — Progress Notes (Signed)
Corey Chen [id number] 1971/05/12 49 y.o.  Subjective:   Patient ID:  Corey Chen is a 50 y.o. (DOB 12-04-1970) male.  Chief Complaint:  Chief Complaint  Patient presents with  . Anxiety  . Depression  . Sleeping Problem    HPI Corey Chen presents to the office today for follow-up of anxiety and depression. He reports that he stopped taking medications about 6 months ago, partly due to adverse reaction that he now thinks was related to a BP or cholesterol medication. He reports that "yesterday was a really bad day" and reports that he spent most of the day crying. He reports persistent sad mood. He reports that he would now like to resume medication for anxiety and depression. He is not sure if he started Sertraline. He reports that he is having remembering things. He has had some irritability after oral steroids and steroid injection a week ago. Sleep has been disrupted after steroids and sleep has been improving some. Has been sleeping excessively. Denies any recent nightmares. He reports chronic intrusive memories and occ re-experiencing. He reports chronic worry. He reports avoiding conflict. Worries about how others perceive. Had some panic attacks in November and December. He reports low energy and motivation. He reports that he has difficulty making himself to do things, even when he recognizes it would be better for his health. He reports frequent negative thoughts. He reports enjoying some things "in spurts." Less interested in hobbies, to include fishing and hunting. He reports that his appetite has been increased and would like to lose weight. He reports that he has gained 40 lbs in the last several years. He reports difficulty making decisions. Has decided not to do some tasks because he cannot make a decision how to proceed. He reports poor concentration and procrastination. Occ passive death wishes. Denies SI.   He reports that he has had increased physical pain since not taking  Cymbalta.   Has 3 daughters. Oldest daughter is married and has a daughter. Another daughter is getting married in November and he has some concerns about who she is marrying. Youngest daughter continues to live with him.   Past Psychiatric Medication Trials: Buspar Cymbalta Lexapro- restless legs Effexor- restless legs Gabapentin Lyrica-Adverse reactions Trazodone Ambien- excessive daytime somnolence Propranolol Doxazosin Rexulti Xanax   Review of Systems:  Review of Systems  Constitutional: Positive for fatigue.  Musculoskeletal: Positive for arthralgias, back pain and myalgias. Negative for gait problem, neck pain and neck stiffness.  Neurological: Positive for dizziness and weakness. Negative for tremors.       Recent severe vertigo  Psychiatric/Behavioral:       Please refer to HPI  Reports elevated LFT's.   Medications: I have reviewed the patient's current medications.  Current Outpatient Medications  Medication Sig Dispense Refill  . esomeprazole (NEXIUM) 40 MG capsule Take 40 mg by mouth daily at 12 noon.    Marland Kitchen ibuprofen (ADVIL,MOTRIN) 400 MG tablet Take 400 mg by mouth every 6 (six) hours as needed.    . meclizine (ANTIVERT) 25 MG tablet Take 25 mg by mouth 3 (three) times daily as needed for dizziness.    Marland Kitchen olmesartan-hydrochlorothiazide (BENICAR HCT) 40-25 MG per tablet Take 1 tablet by mouth daily.    . propranolol (INDERAL) 10 MG tablet TAKE 1-2 TABS BY MOUTH TWICE A DAY AS NEEDED FOR ANXIETY 120 tablet 2  . [START ON 11/15/2019] sertraline (ZOLOFT) 100 MG tablet Take 1 tablet (100 mg total) by mouth daily. 30 tablet 1  .  sertraline (ZOLOFT) 50 MG tablet Take 1/2 tab po qd x 2-4 days, then 1 tab po qd x 1 week, then 2 tabs po qd 60 tablet 0   No current facility-administered medications for this visit.    Medication Side Effects: Other: N/A  Allergies:  Allergies  Allergen Reactions  . Percocet [Oxycodone-Acetaminophen]     Sick to stomach    Past  Medical History:  Diagnosis Date  . Arthritis    back   . Chronic back pain    HNP and stenosis  . Depression   . GERD (gastroesophageal reflux disease)    takes Nexium daily  . Hyperlipidemia    wsa on meds but hasn't been on any in 2-3 months  . Hypertension    takes Benicare HCT daily  . Muscle spasm    takes Zanaflex nightly  . Sleep apnea    sleep study done 5-54yrs ago in [place] but doesn't have any idea where  . Umbilical hernia   . Weakness    numbness and tingling    Family History  Problem Relation Age of Onset  . Anxiety disorder Paternal Uncle   . Depression Paternal Uncle   . Post-traumatic stress disorder Father        PTSD after [place]. 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 Marital status: Single    Spouse name: Not on file  . Number of children: Not on file  . Years of education: Not on file  . Highest education level: Not on file  Occupational History  . Not on file  Tobacco Use  . Smoking status: Never Smoker  . Smokeless tobacco: Never Used  Substance and Sexual Activity  . Alcohol use: Yes    Comment: occasionally  . Drug use: No  . Sexual activity: Not on file  Other Topics Concern  . Not on file  Social History Narrative  . Not on file   Social Determinants of Health   Financial Resource Strain:   . Difficulty of Paying Living Expenses:   Food Insecurity:   . Worried About Programme researcher, broadcasting/film/video in the Last Year:   . Barista in the Last Year:   Transportation Needs:   . Freight forwarder (Medical):   Marland Kitchen Lack of Transportation (Non-Medical):   Physical Activity:   . Days of Exercise per Week:   . Minutes of Exercise per Session:   Stress:   . Feeling of Stress :   Social Connections:   . Frequency of Communication with Friends and Family:   . Frequency of Social Gatherings with Friends and Family:   . Attends Religious Services:   . Active  Member of Clubs or Organizations:   . Attends Banker Meetings:   Marland Kitchen Marital Status:   Intimate Partner Violence:   . Fear of Current or Ex-Partner:   . Emotionally Abused:   Marland Kitchen Physically Abused:   . Sexually Abused:     Past Medical History, Surgical history, Social history, and Family history were reviewed and updated as appropriate.   Please see review of systems for further details on the patient's review from today.   Objective:   Physical Exam:  BP 123/81   Pulse 72   Wt 260 lb (117.9 kg)   BMI 36.26 kg/m   Physical Exam Constitutional:      General: He is not in acute  distress. Musculoskeletal:        General: No deformity.  Neurological:     Mental Status: He is alert and oriented to person, place, and time.     Coordination: Coordination normal.  Psychiatric:        Attention and Perception: Attention and perception normal. He does not perceive auditory or visual hallucinations.        Mood and Affect: Mood is anxious and depressed. Affect is not labile, blunt, angry or inappropriate.        Speech: Speech normal.        Behavior: Behavior normal.        Thought Content: Thought content normal. Thought content is not paranoid or delusional. Thought content does not include homicidal or suicidal ideation. Thought content does not include homicidal or suicidal plan.        Cognition and Memory: Cognition and memory normal.        Judgment: Judgment normal.     Comments: Insight intact     Lab Review:     Component Value Date/Time   NA 140 03/24/2014 1039   K 4.1 03/24/2014 1039   CL 103 03/24/2014 1039   CO2 22 03/24/2014 1039   GLUCOSE 104 (H) 03/24/2014 1039   BUN 15 03/24/2014 1039   CREATININE 0.84 03/24/2014 1039   CALCIUM 9.6 03/24/2014 1039   GFRNONAA >90 03/24/2014 1039   GFRAA >90 03/24/2014 1039       Component Value Date/Time   WBC 5.2 03/24/2014 1039   RBC 5.54 03/24/2014 1039   HGB 16.7 03/24/2014 1039   HCT 46.6  03/24/2014 1039   PLT 161 03/24/2014 1039   MCV 84.1 03/24/2014 1039   MCH 30.1 03/24/2014 1039   MCHC 35.8 03/24/2014 1039   RDW 12.8 03/24/2014 1039    No results found for: POCLITH, LITHIUM   No results found for: PHENYTOIN, PHENOBARB, VALPROATE, CBMZ   .res Assessment: Plan:   Patient seen for 30 minutes and time spent discussing treatment options for anxiety and depression.  Patient reports that his pain has been higher since he has not been taking Cymbalta, however Cymbalta was only mildly effective for his mood and anxiety signs and symptoms.  Discussed options would be to either restart Cymbalta if patient would prefer to focus on improving pain and having some benefit with mood, or starting a medication such as sertraline, at that has indication for PTSD and more consistently improves anxiety signs and symptoms.  Patient agrees to trial of sertraline to improve anxiety and depression.  Start sertraline 25 mg daily for 2 to 4 days, then increase to 50 mg daily for 1 week, then increase to 100 mg daily for anxiety and depression. Discussed considering resuming psychotherapy to help with anxiety signs and symptoms, however patient indicates that in the past he noticed an increase in intrusive memories with therapy, and would therefore prefer not to resume therapy at this time. Will continue propanolol as needed for anxiety. Patient follow-up with this provider in 2 months or sooner if clinically indicated. Patient advised to contact office with any questions, adverse effects, or acute worsening in signs and symptoms.  Tremane was seen today for anxiety, depression and sleeping problem.  Diagnoses and all orders for this visit:  PTSD (post-traumatic stress disorder) -     sertraline (ZOLOFT) 50 MG tablet; Take 1/2 tab po qd x 2-4 days, then 1 tab po qd x 1 week, then 2 tabs po qd -  sertraline (ZOLOFT) 100 MG tablet; Take 1 tablet (100 mg total) by mouth daily. -     propranolol  (INDERAL) 10 MG tablet; TAKE 1-2 TABS BY MOUTH TWICE A DAY AS NEEDED FOR ANXIETY  Severe episode of recurrent major depressive disorder, without psychotic features (HCC) -     sertraline (ZOLOFT) 50 MG tablet; Take 1/2 tab po qd x 2-4 days, then 1 tab po qd x 1 week, then 2 tabs po qd -     sertraline (ZOLOFT) 100 MG tablet; Take 1 tablet (100 mg total) by mouth daily.     Please see After Visit Summary for patient specific instructions.  Future Appointments  Date Time Provider [place]  01/04/2020 11:30 AM Thayer Headings, PMHNP [facility] None    No orders of the defined types were placed in this encounter.   -------------------------------

## 2019-11-28 ENCOUNTER — Other Ambulatory Visit: Payer: Self-pay | Admitting: Psychiatry

## 2019-11-28 DIAGNOSIS — F4310 Post-traumatic stress disorder, unspecified: Secondary | ICD-10-CM

## 2019-11-28 DIAGNOSIS — F332 Major depressive disorder, recurrent severe without psychotic features: Secondary | ICD-10-CM

## 2020-01-04 ENCOUNTER — Ambulatory Visit (INDEPENDENT_AMBULATORY_CARE_PROVIDER_SITE_OTHER): Payer: BLUE CROSS/BLUE SHIELD | Admitting: Psychiatry

## 2020-01-04 ENCOUNTER — Other Ambulatory Visit: Payer: Self-pay

## 2020-01-04 ENCOUNTER — Encounter: Payer: Self-pay | Admitting: Psychiatry

## 2020-01-04 VITALS — BP 143/99 | HR 66 | Wt 260.0 lb

## 2020-01-04 DIAGNOSIS — F332 Major depressive disorder, recurrent severe without psychotic features: Secondary | ICD-10-CM | POA: Diagnosis not present

## 2020-01-04 DIAGNOSIS — F4310 Post-traumatic stress disorder, unspecified: Secondary | ICD-10-CM

## 2020-01-04 DIAGNOSIS — F5105 Insomnia due to other mental disorder: Secondary | ICD-10-CM

## 2020-01-04 MED ORDER — DULOXETINE HCL 60 MG PO CPEP: 60.0000 mg | ORAL_CAPSULE | Freq: Every day | ORAL | 0 refills | Status: DC

## 2020-01-04 MED ORDER — DULOXETINE HCL 30 MG PO CPEP: ORAL_CAPSULE | ORAL | 0 refills | Status: DC

## 2020-01-04 MED ORDER — PROPRANOLOL HCL 10 MG PO TABS: ORAL_TABLET | ORAL | 2 refills | Status: DC

## 2020-01-04 NOTE — Progress Notes (Signed)
Corey Chen [id number] Oct 11, 1970 49 y.o.  Subjective:   Patient ID:  Corey Chen is a 49 y.o. (DOB Aug 25, 1970) male.  Chief Complaint:  Chief Complaint  Patient presents with   Anxiety   Depression   Sleeping Problem    HPI Corey Chen presents to the office today for follow-up of anxiety, depression, and insomnia. He reports that he tried Sertraline and had some GI s/s for about one week, then increased to 100 mg po qd and slept excessively and felt "out of it." He then decreased to 1 tab po qd and then discontinued Sertraline.   He reports that he is irritated by the people around him. He reports that he frequently worries about things, to include family stressors and dynamics. He reports that he then decided to distance from family of origin since this has caused him significant stress. He reports that he has felt better since making this decision. He experiences frequent anxiety and that Propranolol 20 mg helps ease anxiety somewhat. Will experience tightness in his chest, shortness of breath, and sometimes feels as if heart rate is elevated. He reports that he has frequent panic attacks. He reports that he has difficulty handling stress and problem-solving. He reports periods of depression.  He reports poor concentration and focus. He reports that he has difficulty keeping up with events and appointments. He reports that sleep has been poor and will toss and turn throughout the night. He reports back pain will interrupt sleep at times. He reports having frequent nightmares and stress dreams. He reports that he has had increased appetite and reports, "I can't stop eating." He reports that he is currently at his highest weight. He reports that his energy and motivation have been low. He reports having passive death wishes. Denies SI.   He reports h/o multiple head injuries in childhood. He reports that he has chronic memory difficulties.    Past Psychiatric Medication  Trials: Buspar Cymbalta Lexapro- restless legs Effexor- restless legs Sertraline- excessive somnolence, Gi side effects Gabapentin Lyrica-Adverse reactions Trazodone Ambien- excessive daytime somnolence Propranolol Doxazosin Rexulti Xanax   Review of Systems:  Review of Systems  Musculoskeletal: Positive for arthralgias and back pain. Negative for gait problem.       Shoulder pain  Neurological: Negative for tremors.       RLS  Psychiatric/Behavioral:       Please refer to HPI    Medications: I have reviewed the patient's current medications.  Current Outpatient Medications  Medication Sig Dispense Refill   aspirin EC 81 MG tablet Take 81 mg by mouth daily. Swallow whole.     esomeprazole (NEXIUM) 40 MG capsule Take 40 mg by mouth daily at 12 noon.     ibuprofen (ADVIL,MOTRIN) 400 MG tablet Take 400 mg by mouth every 6 (six) hours as needed.     meclizine (ANTIVERT) 25 MG tablet Take 25 mg by mouth 3 (three) times daily as needed for dizziness.     olmesartan-hydrochlorothiazide (BENICAR HCT) 40-25 MG per tablet Take 1 tablet by mouth daily.     propranolol (INDERAL) 10 MG tablet TAKE 1-2 TABS BY MOUTH TWICE A DAY AS NEEDED FOR ANXIETY 120 tablet 2   DULoxetine (CYMBALTA) 30 MG capsule Take 1 capsule po q am x 1 week, then 2 capsules po q am 30 capsule 0   DULoxetine (CYMBALTA) 60 MG capsule Take 1 capsule (60 mg total) by mouth daily. 90 capsule 0   sertraline (ZOLOFT) 100 MG tablet Take 1  tablet (100 mg total) by mouth daily. (Patient not taking: Reported on 01/04/2020) 30 tablet 1   sertraline (ZOLOFT) 50 MG tablet Take 1/2 tab po qd x 2-4 days, then 1 tab po qd x 1 week, then 2 tabs po qd (Patient not taking: Reported on 01/04/2020) 60 tablet 0   No current facility-administered medications for this visit.    Medication Side Effects: Nausea and Sedation  Allergies:  Allergies  Allergen Reactions   Percocet [Oxycodone-Acetaminophen]     Sick to stomach     Past Medical History:  Diagnosis Date   Arthritis    back    Chronic back pain    HNP and stenosis   Depression    GERD (gastroesophageal reflux disease)    takes Nexium daily   Hyperlipidemia    wsa on meds but hasn't been on any in 2-3 months   Hypertension    takes Benicare HCT daily   Muscle spasm    takes Zanaflex nightly   Sleep apnea    sleep study done 5-73yrs ago in [place] but doesn't have any idea where   Umbilical hernia    Weakness    numbness and tingling    Family History  Problem Relation Age of Onset   Anxiety disorder Paternal Uncle    Depression Paternal Uncle    Post-traumatic stress disorder Father        PTSD after [place]. 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    Social History   Socioeconomic History   Marital status: Single    Spouse name: Not on file   Number of children: Not on file   Years of education: Not on file   Highest education level: Not on file  Occupational History   Not on file  Tobacco Use   Smoking status: Never Smoker   Smokeless tobacco: Never Used  Substance and Sexual Activity   Alcohol use: Yes    Comment: occasionally   Drug use: No   Sexual activity: Not on file  Other Topics Concern   Not on file  Social History Narrative   Not on file   Social Determinants of Health   Financial Resource Strain:    Difficulty of Paying Living Expenses:   Food Insecurity:    Worried About Charity fundraiser in the Last Year:    Arboriculturist in the Last Year:   Transportation Needs:    Film/video editor (Medical):    Lack of Transportation (Non-Medical):   Physical Activity:    Days of Exercise per Week:    Minutes of Exercise per Session:   Stress:    Feeling of Stress :   Social Connections:    Frequency of Communication with Friends and Family:    Frequency of Social Gatherings with Friends and Family:    Attends Religious Services:     Active Member of Clubs or Organizations:    Attends Music therapist:    Marital Status:   Intimate Partner Violence:    Fear of Current or Ex-Partner:    Emotionally Abused:    Physically Abused:    Sexually Abused:     Past Medical History, Surgical history, Social history, and Family history were reviewed and updated as appropriate.   Please see review of systems for further details on the patient's review from today.   Objective:   Physical Exam:  BP (!) 143/99    Pulse 66  Wt 260 lb (117.9 kg)    BMI 36.26 kg/m   Physical Exam Constitutional:      General: He is not in acute distress. Musculoskeletal:        General: No deformity.  Neurological:     Mental Status: He is alert and oriented to person, place, and time.     Coordination: Coordination normal.  Psychiatric:        Attention and Perception: Attention and perception normal. He does not perceive auditory or visual hallucinations.        Mood and Affect: Mood is anxious and depressed. Affect is not labile, blunt, angry or inappropriate.        Speech: Speech normal.        Behavior: Behavior normal.        Thought Content: Thought content normal. Thought content is not paranoid or delusional. Thought content does not include homicidal or suicidal ideation. Thought content does not include homicidal or suicidal plan.        Cognition and Memory: Cognition and memory normal.        Judgment: Judgment normal.     Comments: Insight intact     Lab Review:     Component Value Date/Time   NA 140 03/24/2014 1039   K 4.1 03/24/2014 1039   CL 103 03/24/2014 1039   CO2 22 03/24/2014 1039   GLUCOSE 104 (H) 03/24/2014 1039   BUN 15 03/24/2014 1039   CREATININE 0.84 03/24/2014 1039   CALCIUM 9.6 03/24/2014 1039   GFRNONAA >90 03/24/2014 1039   GFRAA >90 03/24/2014 1039       Component Value Date/Time   WBC 5.2 03/24/2014 1039   RBC 5.54 03/24/2014 1039   HGB 16.7 03/24/2014 1039   HCT  46.6 03/24/2014 1039   PLT 161 03/24/2014 1039   MCV 84.1 03/24/2014 1039   MCH 30.1 03/24/2014 1039   MCHC 35.8 03/24/2014 1039   RDW 12.8 03/24/2014 1039    No results found for: POCLITH, LITHIUM   No results found for: PHENYTOIN, PHENOBARB, VALPROATE, CBMZ   .res Assessment: Plan:   Discussed tx options for depression and anxiety.  Discussed potential benefits, risks, and side effects of Prozac since this may be less likely to cause excessive somnolence, however Prozac can cause RLS and patient has history of RLS with other SSRIs.  Discussed other option of resuming Cymbalta since Cymbalta has been most effective for his depression and has also been somewhat helpful for anxiety and for pain.  Patient reports that he would like to resume Cymbalta.  We will restart Cymbalta with a target dose of 60 mg daily since this dose was effective and well-tolerated in the past and patient had side effects at 90 mg daily with out any significant increase in benefit. Patient to follow-up in 3 to 4 months or sooner if clinically indicated. Patient advised to contact office with any questions, adverse effects, or acute worsening in signs and symptoms.  Corey Chen was seen today for anxiety, depression and sleeping problem.  Diagnoses and all orders for this visit:  PTSD (post-traumatic stress disorder) -     DULoxetine (CYMBALTA) 30 MG capsule; Take 1 capsule po q am x 1 week, then 2 capsules po q am -     DULoxetine (CYMBALTA) 60 MG capsule; Take 1 capsule (60 mg total) by mouth daily. -     propranolol (INDERAL) 10 MG tablet; TAKE 1-2 TABS BY MOUTH TWICE A DAY AS NEEDED FOR ANXIETY  Severe episode of recurrent major depressive disorder, without psychotic features (HCC) -     DULoxetine (CYMBALTA) 30 MG capsule; Take 1 capsule po q am x 1 week, then 2 capsules po q am -     DULoxetine (CYMBALTA) 60 MG capsule; Take 1 capsule (60 mg total) by mouth daily.  Insomnia due to mental disorder     Please  see After Visit Summary for patient specific instructions.  Future Appointments  Date Time Provider Department [facility]  05/05/2020 11:00 AM Corie Chiquito, PMHNP [facility] None    No orders of the defined types were placed in this encounter.   -------------------------------

## 2020-02-01 ENCOUNTER — Other Ambulatory Visit: Payer: Self-pay

## 2020-02-01 ENCOUNTER — Telehealth: Payer: Self-pay | Admitting: Psychiatry

## 2020-02-01 DIAGNOSIS — F4310 Post-traumatic stress disorder, unspecified: Secondary | ICD-10-CM

## 2020-02-01 DIAGNOSIS — F332 Major depressive disorder, recurrent severe without psychotic features: Secondary | ICD-10-CM

## 2020-02-01 MED ORDER — DULOXETINE HCL 60 MG PO CPEP: 60.0000 mg | ORAL_CAPSULE | Freq: Every day | ORAL | 0 refills | Status: DC

## 2020-02-01 NOTE — Telephone Encounter (Signed)
Pt called to report [facility] never received Cymbalta 60 mg Rx #90.  sent on 01/04/20. Please resend

## 2020-02-01 NOTE — Telephone Encounter (Signed)
Rx from 01/04/2020 Duloxetine 60 mg resent to [facility]

## 2020-05-05 ENCOUNTER — Ambulatory Visit (INDEPENDENT_AMBULATORY_CARE_PROVIDER_SITE_OTHER): Payer: BLUE CROSS/BLUE SHIELD | Admitting: Psychiatry

## 2020-05-05 ENCOUNTER — Encounter: Payer: Self-pay | Admitting: Psychiatry

## 2020-05-05 ENCOUNTER — Other Ambulatory Visit: Payer: Self-pay

## 2020-05-05 DIAGNOSIS — F5105 Insomnia due to other mental disorder: Secondary | ICD-10-CM | POA: Diagnosis not present

## 2020-05-05 DIAGNOSIS — F4310 Post-traumatic stress disorder, unspecified: Secondary | ICD-10-CM

## 2020-05-05 DIAGNOSIS — F332 Major depressive disorder, recurrent severe without psychotic features: Secondary | ICD-10-CM

## 2020-05-05 MED ORDER — DULOXETINE HCL 60 MG PO CPEP: 60.0000 mg | ORAL_CAPSULE | Freq: Every day | ORAL | 1 refills | Status: DC

## 2020-05-05 MED ORDER — ALPRAZOLAM 0.5 MG PO TABS: ORAL_TABLET | ORAL | 3 refills | Status: DC

## 2020-05-05 NOTE — Progress Notes (Signed)
Corey Chen [id number] 29-Apr-1971 49 y.o.  Subjective:   Patient ID:  Corey Chen is a 49 y.o. (DOB 1970-09-02) male.  Chief Complaint:  Chief Complaint  Patient presents with  . Anxiety  . Depression  . Sleeping Problem    HPI Corey Chen presents to the office today for follow-up of anxiety, depression, and insomnia. He reports that he has had difficulty with cost of visits since insurance does not cover out of state visits. He has disability hearing in early November. He reports that he has been having severe anxiety and that Propranolol has been ineffective. He reports that he has had a few panic attacks and at other times will experience tightening in his chest without full panic attack. He reports that he has intrusive memories and flashbacks in response to seeing or hearing certain things. He reports that he been having some nightmares. He reports that all of his dreams are upsetting and stressful. He reports frequent worry and anxious thoughts. He reports that he tends to isolate from others since this tends to increase his anxiety and/or cause irritability. He reports that he usually will not leave home except to grocery store or places nearby and prefers to have someone he trusts with him. He reports that he will "say things I shouldn't say" at times. He reports depressed mood and periods of tearfulness. Has been sleeping better recently and reports that he has periods of 1-2 weeks with poor sleep with frequent awakenings. He reports low motivation and energy. He reports that he has poor concentration and memory difficulties. He reports, "I don't want to be here" and has occasional passive death wishes without SI- "I don't want to do that."   He reports that Cymbalta has helped some with anxiety and depression, and also physical pain.   Anniversary of father's death was 10/3. Reports conflict with some family members.   Past Psychiatric Medication  Trials: Buspar Cymbalta Lexapro- restless legs Effexor- restless legs Sertraline- excessive somnolence, Gi side effects Gabapentin Lyrica-Adverse reactions Trazodone Ambien- excessive daytime somnolence Propranolol- ineffective Doxazosin Rexulti Xanax    Review of Systems:  Review of Systems  Musculoskeletal: Positive for arthralgias and back pain. Negative for gait problem.  Neurological: Positive for weakness.  Psychiatric/Behavioral:       Please refer to HPI   Questions if he may have had mono after having physical s/s similar to family members that were dx'd with mono.  Medications: I have reviewed the patient's current medications.  Current Outpatient Medications  Medication Sig Dispense Refill  . DULoxetine (CYMBALTA) 60 MG capsule Take 1 capsule (60 mg total) by mouth daily. 90 capsule 1  . esomeprazole (NEXIUM) 40 MG capsule Take 40 mg by mouth daily at 12 noon.    Marland Kitchen ibuprofen (ADVIL,MOTRIN) 400 MG tablet Take 400 mg by mouth every 6 (six) hours as needed.    . meclizine (ANTIVERT) 25 MG tablet Take 25 mg by mouth 3 (three) times daily as needed for dizziness.    Marland Kitchen olmesartan-hydrochlorothiazide (BENICAR HCT) 40-25 MG per tablet Take 1 tablet by mouth daily.    Marland Kitchen ALPRAZolam (XANAX) 0.5 MG tablet Take 1/2-1 tab po qd prn panic 15 tablet 3  . aspirin EC 81 MG tablet Take 81 mg by mouth daily. Swallow whole. (Patient not taking: Reported on 05/05/2020)     No current facility-administered medications for this visit.    Medication Side Effects: None  Allergies:  Allergies  Allergen Reactions  . Percocet [Oxycodone-Acetaminophen]  Sick to stomach    Past Medical History:  Diagnosis Date  . Arthritis    back   . Chronic back pain    HNP and stenosis  . Depression   . GERD (gastroesophageal reflux disease)    takes Nexium daily  . Hyperlipidemia    wsa on meds but hasn't been on any in 2-3 months  . Hypertension    takes Benicare HCT daily  . Muscle  spasm    takes Zanaflex nightly  . Sleep apnea    sleep study done 5-25yrs ago in [place] but doesn't have any idea where  . Umbilical hernia   . Weakness    numbness and tingling    Family History  Problem Relation Age of Onset  . Anxiety disorder Paternal Uncle   . Depression Paternal Uncle   . Post-traumatic stress disorder Father        PTSD after [place]. 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 Marital status: Single    Spouse name: Not on file  . Number of children: Not on file  . Years of education: Not on file  . Highest education level: Not on file  Occupational History  . Not on file  Tobacco Use  . Smoking status: Never Smoker  . Smokeless tobacco: Never Used  Substance and Sexual Activity  . Alcohol use: Yes    Comment: occasionally  . Drug use: No  . Sexual activity: Not on file  Other Topics Concern  . Not on file  Social History Narrative  . Not on file   Social Determinants of Health   Financial Resource Strain:   . Difficulty of Paying Living Expenses: Not on file  Food Insecurity:   . Worried About Programme researcher, broadcasting/film/video in the Last Year: Not on file  . Ran Out of Food in the Last Year: Not on file  Transportation Needs:   . Lack of Transportation (Medical): Not on file  . Lack of Transportation (Non-Medical): Not on file  Physical Activity:   . Days of Exercise per Week: Not on file  . Minutes of Exercise per Session: Not on file  Stress:   . Feeling of Stress : Not on file  Social Connections:   . Frequency of Communication with Friends and Family: Not on file  . Frequency of Social Gatherings with Friends and Family: Not on file  . Attends Religious Services: Not on file  . Active Member of Clubs or Organizations: Not on file  . Attends Banker Meetings: Not on file  . Marital Status: Not on file  Intimate Partner Violence:   . Fear of Current or  Ex-Partner: Not on file  . Emotionally Abused: Not on file  . Physically Abused: Not on file  . Sexually Abused: Not on file    Past Medical History, Surgical history, Social history, and Family history were reviewed and updated as appropriate.   Please see review of systems for further details on the patient's review from today.   Objective:   Physical Exam:  There were no vitals taken for this visit.  Physical Exam Constitutional:      General: He is not in acute distress. Musculoskeletal:        General: No deformity.  Neurological:     Mental Status: He is alert and oriented to person, place, and time.     Coordination: Coordination  normal.  Psychiatric:        Attention and Perception: Attention and perception normal. He does not perceive auditory or visual hallucinations.        Mood and Affect: Mood is anxious and depressed. Affect is tearful. Affect is not labile, blunt, angry or inappropriate.        Speech: Speech normal.        Behavior: Behavior is cooperative.        Thought Content: Thought content normal. Thought content is not paranoid or delusional. Thought content does not include homicidal or suicidal ideation. Thought content does not include homicidal or suicidal plan.        Cognition and Memory: Cognition and memory normal.        Judgment: Judgment normal.     Comments: Insight intact Appears to be in pain (sitting on edge of seat)     Lab Review:     Component Value Date/Time   NA 140 03/24/2014 1039   K 4.1 03/24/2014 1039   CL 103 03/24/2014 1039   CO2 22 03/24/2014 1039   GLUCOSE 104 (H) 03/24/2014 1039   BUN 15 03/24/2014 1039   CREATININE 0.84 03/24/2014 1039   CALCIUM 9.6 03/24/2014 1039   GFRNONAA >90 03/24/2014 1039   GFRAA >90 03/24/2014 1039       Component Value Date/Time   WBC 5.2 03/24/2014 1039   RBC 5.54 03/24/2014 1039   HGB 16.7 03/24/2014 1039   HCT 46.6 03/24/2014 1039   PLT 161 03/24/2014 1039   MCV 84.1 03/24/2014  1039   MCH 30.1 03/24/2014 1039   MCHC 35.8 03/24/2014 1039   RDW 12.8 03/24/2014 1039    No results found for: POCLITH, LITHIUM   No results found for: PHENYTOIN, PHENOBARB, VALPROATE, CBMZ   .res Assessment: Plan:   Discussed potential benefits, risk, and side effects of benzodiazepines to include potential risk of tolerance and dependence, as well as possible drowsiness.  Advised patient not to drive if experiencing drowsiness and to take lowest possible effective dose to minimize risk of dependence and tolerance.  Will write prescription for Xanax 0.5 mg 1/2-1 tab daily as needed for severe anxiety or panic. Continue Cymbalta 60 mg daily for depression and anxiety. Patient request to extend follow-up due to financial constraints.  Patient to follow-up in 4 months or sooner if clinically indicated.   Patient advised to contact office with any questions, adverse effects, or acute worsening in signs and symptoms.   Corey JohnBrian was seen today for anxiety, depression and sleeping problem.  Diagnoses and all orders for this visit:  PTSD (post-traumatic stress disorder) -     Discontinue: ALPRAZolam (XANAX) 0.5 MG tablet; Take 1/2-1 tab po qd prn panic -     Discontinue: DULoxetine (CYMBALTA) 60 MG capsule; Take 1 capsule (60 mg total) by mouth daily. -     ALPRAZolam (XANAX) 0.5 MG tablet; Take 1/2-1 tab po qd prn panic -     DULoxetine (CYMBALTA) 60 MG capsule; Take 1 capsule (60 mg total) by mouth daily.  Severe episode of recurrent major depressive disorder, without psychotic features (HCC) -     Discontinue: DULoxetine (CYMBALTA) 60 MG capsule; Take 1 capsule (60 mg total) by mouth daily. -     DULoxetine (CYMBALTA) 60 MG capsule; Take 1 capsule (60 mg total) by mouth daily.  Insomnia due to mental disorder     Please see After Visit Summary for patient specific instructions.  Future Appointments  Date  Time Provider Department Center  09/05/2020 11:00 AM Corie Chiquito, PMHNP  [facility] None    No orders of the defined types were placed in this encounter.   -------------------------------

## 2020-05-27 ENCOUNTER — Other Ambulatory Visit: Payer: Self-pay | Admitting: Psychiatry

## 2020-05-27 DIAGNOSIS — F4310 Post-traumatic stress disorder, unspecified: Secondary | ICD-10-CM

## 2020-07-24 ENCOUNTER — Other Ambulatory Visit: Payer: Self-pay | Admitting: Psychiatry

## 2020-07-24 DIAGNOSIS — F4310 Post-traumatic stress disorder, unspecified: Secondary | ICD-10-CM

## 2020-07-24 DIAGNOSIS — F332 Major depressive disorder, recurrent severe without psychotic features: Secondary | ICD-10-CM

## 2020-08-16 ENCOUNTER — Other Ambulatory Visit: Payer: Self-pay | Admitting: Psychiatry

## 2020-08-16 DIAGNOSIS — F332 Major depressive disorder, recurrent severe without psychotic features: Secondary | ICD-10-CM

## 2020-08-16 DIAGNOSIS — F4310 Post-traumatic stress disorder, unspecified: Secondary | ICD-10-CM

## 2020-09-05 ENCOUNTER — Encounter: Payer: Self-pay | Admitting: Psychiatry

## 2020-09-05 ENCOUNTER — Other Ambulatory Visit: Payer: Self-pay

## 2020-09-05 ENCOUNTER — Ambulatory Visit (INDEPENDENT_AMBULATORY_CARE_PROVIDER_SITE_OTHER): Payer: BLUE CROSS/BLUE SHIELD | Admitting: Psychiatry

## 2020-09-05 VITALS — BP 137/94 | HR 85

## 2020-09-05 DIAGNOSIS — F332 Major depressive disorder, recurrent severe without psychotic features: Secondary | ICD-10-CM

## 2020-09-05 DIAGNOSIS — F5105 Insomnia due to other mental disorder: Secondary | ICD-10-CM

## 2020-09-05 DIAGNOSIS — F4310 Post-traumatic stress disorder, unspecified: Secondary | ICD-10-CM | POA: Diagnosis not present

## 2020-09-05 MED ORDER — ALPRAZOLAM 0.5 MG PO TABS
ORAL_TABLET | ORAL | 1 refills | Status: DC
Start: 2020-09-05 — End: 2021-03-02

## 2020-09-05 MED ORDER — LAMOTRIGINE 25 MG PO TABS: ORAL_TABLET | ORAL | 0 refills | Status: DC

## 2020-09-05 MED ORDER — LAMOTRIGINE 100 MG PO TABS: ORAL_TABLET | ORAL | 0 refills | Status: DC

## 2020-09-05 NOTE — Progress Notes (Signed)
Corey Chen [id number] 08/18/1970 50 y.o.  Subjective:   Patient ID:  Allah Reason is a 50 y.o. (DOB 1971-02-09) male.  Chief Complaint:  Chief Complaint  Patient presents with  . Anxiety  . Depression  . Sleeping Problem    HPI Corey Chen presents to the office today for follow-up of anxiety, depression, and sleep disturbance. He reports, "Same old thing." He reports that his anxiety has been elevated. Reports feeling often on the verge of panic. Reports feeling "irritated, on edge." Reports that he will say things that he regrets. Reports negative thoughts. Describes mood reactivity and "can't let it go." He reports continued family stressors. He reports financial stress. Reports rumination and catastrophic thinking. Some intrusive memories and reports that this has improved slightly. Avoids social events. Reports continued depression. He reports that he has been having difficulty falling asleep and PCP prescribed Ambien and this has been helpful. He reports that he has difficulty sleeping some nights and at times will experience hypersomnolence. Reports having some nightmares. Appetite has been more than he would like- "I can't cut it off." He reports poor concentration and focus. He reports that he has difficulty remembering things, such as when appointments are. Reports anhedonia. "Just not happy." He reports that his energy is low and reports feeling weak. Motivation has been low.  Denies SI.   He reports excessive somnolence with Xanax.    He is applying for disability.   Past Psychiatric Medication Trials: Buspar Cymbalta Lexapro- restless legs Effexor- restless legs Sertraline- excessive somnolence, Gi side effects Gabapentin Lyrica-Adverse reactions Trazodone Ambien- excessive daytime somnolence Propranolol- ineffective Doxazosin Rexulti Xanax- Effective. Hypersomnolence Ambien  Review of Systems:  Review of Systems  Respiratory: Positive for chest tightness and  shortness of breath.   Gastrointestinal: Positive for abdominal distention and nausea.  Musculoskeletal: Positive for arthralgias and back pain. Negative for gait problem.  Neurological: Positive for dizziness and weakness.  Psychiatric/Behavioral:       Please refer to HPI  Had COVID 1.5 months ago. He reports that he has felt worse overall since COVID. He reports that change in BP med seems to have a negative effect. He has been referred to a cardiologist. Has upcoming sleep study.   Medications: I have reviewed the patient's current medications.  Current Outpatient Medications  Medication Sig Dispense Refill  . dexlansoprazole (DEXILANT) 60 MG capsule Take by mouth.    . DULoxetine (CYMBALTA) 60 MG capsule TAKE 1 CAPSULE BY MOUTH EVERY DAY 90 capsule 0  . ibuprofen (ADVIL,MOTRIN) 400 MG tablet Take 400 mg by mouth every 6 (six) hours as needed.    . lamoTRIgine (LAMICTAL) 100 MG tablet Take 1 tab po qd x 2 weeks, then increase to 1.5 tabs po qd 45 tablet 0  . lamoTRIgine (LAMICTAL) 25 MG tablet Take 1 tablet (25 mg total) by mouth daily for 14 days, THEN 2 tablets (50 mg total) daily for 14 days. 45 tablet 0  . meclizine (ANTIVERT) 25 MG tablet Take 25 mg by mouth 3 (three) times daily as needed for dizziness.    Marland Kitchen olmesartan-hydrochlorothiazide (BENICAR HCT) 40-25 MG per tablet Take 1 tablet by mouth daily.    Marland Kitchen zolpidem (AMBIEN) 10 MG tablet Take 10 mg by mouth at bedtime as needed.    . ALPRAZolam (XANAX) 0.5 MG tablet Take 1/2-1 tab po qd prn panic 15 tablet 1  . aspirin EC 81 MG tablet Take 81 mg by mouth daily. Swallow whole. (Patient not taking: No sig  reported)     No current facility-administered medications for this visit.    Medication Side Effects: Other: Hypersomnolence with Xanax  Allergies:  Allergies  Allergen Reactions  . Percocet [Oxycodone-Acetaminophen]     Sick to stomach    Past Medical History:  Diagnosis Date  . Arthritis    back   . Chronic back pain     HNP and stenosis  . Depression   . GERD (gastroesophageal reflux disease)    takes Nexium daily  . Hyperlipidemia    wsa on meds but hasn't been on any in 2-3 months  . Hypertension    takes Benicare HCT daily  . Muscle spasm    takes Zanaflex nightly  . Sleep apnea    sleep study done 5-56yrs ago in [place] but doesn't have any idea where  . Umbilical hernia   . Weakness    numbness and tingling    Family History  Problem Relation Age of Onset  . Anxiety disorder Paternal Uncle   . Depression Paternal Uncle   . Post-traumatic stress disorder Father        PTSD after [place]. 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 Marital status: Single    Spouse name: Not on file  . Number of children: Not on file  . Years of education: Not on file  . Highest education level: Not on file  Occupational History  . Not on file  Tobacco Use  . Smoking status: Never Smoker  . Smokeless tobacco: Never Used  Substance and Sexual Activity  . Alcohol use: Yes    Comment: occasionally  . Drug use: No  . Sexual activity: Not on file  Other Topics Concern  . Not on file  Social History Narrative  . Not on file   Social Determinants of Health   Financial Resource Strain: Not on file  Food Insecurity: Not on file  Transportation Needs: Not on file  Physical Activity: Not on file  Stress: Not on file  Social Connections: Not on file  Intimate Partner Violence: Not on file    Past Medical History, Surgical history, Social history, and Family history were reviewed and updated as appropriate.   Please see review of systems for further details on the patient's review from today.   Objective:   Physical Exam:  BP (!) 137/94   Pulse 85   Physical Exam Constitutional:      General: He is not in acute distress. Musculoskeletal:        General: No deformity.  Neurological:     Mental Status: He is alert and  oriented to person, place, and time.     Coordination: Coordination normal.  Psychiatric:        Attention and Perception: Attention and perception normal. He does not perceive auditory or visual hallucinations.        Mood and Affect: Mood is anxious and depressed. Affect is not labile, blunt, angry or inappropriate.        Speech: Speech normal.        Behavior: Behavior normal.        Thought Content: Thought content normal. Thought content is not paranoid or delusional. Thought content does not include homicidal or suicidal ideation. Thought content does not include homicidal or suicidal plan.        Cognition and Memory: Cognition and memory normal.  Judgment: Judgment normal.     Comments: Insight intact     Lab Review:     Component Value Date/Time   NA 140 03/24/2014 1039   K 4.1 03/24/2014 1039   CL 103 03/24/2014 1039   CO2 22 03/24/2014 1039   GLUCOSE 104 (H) 03/24/2014 1039   BUN 15 03/24/2014 1039   CREATININE 0.84 03/24/2014 1039   CALCIUM 9.6 03/24/2014 1039   GFRNONAA >90 03/24/2014 1039   GFRAA >90 03/24/2014 1039       Component Value Date/Time   WBC 5.2 03/24/2014 1039   RBC 5.54 03/24/2014 1039   HGB 16.7 03/24/2014 1039   HCT 46.6 03/24/2014 1039   PLT 161 03/24/2014 1039   MCV 84.1 03/24/2014 1039   MCH 30.1 03/24/2014 1039   MCHC 35.8 03/24/2014 1039   RDW 12.8 03/24/2014 1039    No results found for: POCLITH, LITHIUM   No results found for: PHENYTOIN, PHENOBARB, VALPROATE, CBMZ   .res Assessment: Plan:   Discussed possible treatment options for depression and anxiety.  Patient reports that he would like to continue Cymbalta since he has had worsening pain with Cymbalta has been decreased.  He also reports that Cymbalta has been more effective for his mood and anxiety signs and symptoms.  Discussed augmentation strategies to add to Cymbalta to improve mood and anxiety. Counseled patient regarding potential benefits, risks, and side effects  of Lamictal to include potential risk of Stevens-Johnson syndrome.  Discussed that lamotrigine is approved for bipolar depression and using lamotrigine for depression without bipolar disorder is an off label indication.  Advised patient to stop taking Lamictal and contact office immediately if rash develops and to seek urgent medical attention if rash is severe and/or spreading quickly. Will start Lamictal 25 mg daily for 2 weeks, then increase to 50 mg daily for 2 weeks, then 100 mg daily for 2 weeks, then 150 mg daily for mood symptoms.  Continue Cymbalta 60 mg daily for mood and anxiety. Continue alprazolam as needed for anxiety. Patient to follow-up in 2 months or sooner if clinically indicated. Patient advised to contact office with any questions, adverse effects, or acute worsening in signs and symptoms.  Corey Chen was seen today for anxiety, depression and sleeping problem.  Diagnoses and all orders for this visit:  PTSD (post-traumatic stress disorder) -     ALPRAZolam (XANAX) 0.5 MG tablet; Take 1/2-1 tab po qd prn panic  Severe episode of recurrent major depressive disorder, without psychotic features (HCC) -     lamoTRIgine (LAMICTAL) 25 MG tablet; Take 1 tablet (25 mg total) by mouth daily for 14 days, THEN 2 tablets (50 mg total) daily for 14 days. -     lamoTRIgine (LAMICTAL) 100 MG tablet; Take 1 tab po qd x 2 weeks, then increase to 1.5 tabs po qd     Please see After Visit Summary for patient specific instructions.  Future Appointments  Date Time Provider Department Center  11/02/2020 11:00 AM Corie Chiquito, PMHNP CP-[facility] None    No orders of the defined types were placed in this encounter.   -------------------------------

## 2020-09-26 ENCOUNTER — Other Ambulatory Visit: Payer: Self-pay | Admitting: Psychiatry

## 2020-09-26 DIAGNOSIS — F332 Major depressive disorder, recurrent severe without psychotic features: Secondary | ICD-10-CM

## 2020-10-05 ENCOUNTER — Other Ambulatory Visit: Payer: Self-pay | Admitting: Psychiatry

## 2020-10-05 DIAGNOSIS — F332 Major depressive disorder, recurrent severe without psychotic features: Secondary | ICD-10-CM

## 2020-10-11 ENCOUNTER — Telehealth: Payer: Self-pay | Admitting: Psychiatry

## 2020-10-11 NOTE — Telephone Encounter (Signed)
Pt called requesting sooner apt. Not available. Apt 4/14. If no apt, will need to talk with J.Carter about his meds. He's just sick.  Pt contact [phone].

## 2020-10-11 NOTE — Telephone Encounter (Signed)
I will try to contact him in the morning to get symptoms

## 2020-10-12 NOTE — Telephone Encounter (Signed)
Rtc to pt and he reports he has been having a lot of tests run due to his symptoms not improving after Covid. He was afraid to start Lamicatl until tests were over, he did start a couple days ago he reports. Discussed the process of Lamictal and the slow taper. He reports getting really bad news a few days ago and that's also exacerbated things. Asking to get in sooner. Informed him I would update Shanda Bumps but it's important he take the Lamictal as directed and use Xanax prn, he reports he only takes that when he's really bad. He is still on Cymbalta too.

## 2020-10-13 NOTE — Telephone Encounter (Signed)
Rtc to pt and he did receive a voicemail today from someone he's scheduled this Wednesday, 3/30. He will be at apt. He reports his anxiety is worse, has several factors causing that. Reports the Lamotrigine is going well, no problems. Depression about the same.  Informed him I would update Shanda Bumps and if she recommended anything I would call back if not we would see him Wednesday.

## 2020-10-18 ENCOUNTER — Other Ambulatory Visit: Payer: Self-pay

## 2020-10-18 ENCOUNTER — Encounter: Payer: Self-pay | Admitting: Psychiatry

## 2020-10-18 ENCOUNTER — Ambulatory Visit (INDEPENDENT_AMBULATORY_CARE_PROVIDER_SITE_OTHER): Payer: BLUE CROSS/BLUE SHIELD | Admitting: Psychiatry

## 2020-10-18 VITALS — BP 130/88 | HR 61 | Wt 258.0 lb

## 2020-10-18 DIAGNOSIS — F5105 Insomnia due to other mental disorder: Secondary | ICD-10-CM | POA: Diagnosis not present

## 2020-10-18 DIAGNOSIS — F4310 Post-traumatic stress disorder, unspecified: Secondary | ICD-10-CM

## 2020-10-18 DIAGNOSIS — F332 Major depressive disorder, recurrent severe without psychotic features: Secondary | ICD-10-CM | POA: Diagnosis not present

## 2020-10-18 MED ORDER — CLONAZEPAM 0.5 MG PO TABS: 0.5000 mg | ORAL_TABLET | Freq: Two times a day (BID) | ORAL | 1 refills | Status: DC | PRN

## 2020-10-18 MED ORDER — DULOXETINE HCL 60 MG PO CPEP: 60.0000 mg | ORAL_CAPSULE | Freq: Every day | ORAL | 0 refills | Status: DC

## 2020-10-18 MED ORDER — PROPRANOLOL HCL 20 MG PO TABS: 20.0000 mg | ORAL_TABLET | Freq: Two times a day (BID) | ORAL | 0 refills | Status: DC

## 2020-10-18 NOTE — Progress Notes (Signed)
Corey Chen [id number] 04-20-1971 50 y.o.  Subjective:   Patient ID:  Corey Chen is a 50 y.o. (DOB 01-Dec-1970) male.  Chief Complaint:  Chief Complaint  Patient presents with  . Anxiety    HPI Corey Chen presents to the office today emergently for anxiety and depression. He reports that he has been having physical issues- upset stomach/nausea, chest tightness, low energy, etc. He reports that he has seen several medical providers (GI specialist, echocardiogram, etc) and has been told that his s/s are likely due to anxiety. He reports that he has not felt well since he had COVID. He reports that he is having panic attacks every 1-2 days. He reports that Xanax is effective for panic attacks. He reports constant worry- "I can't cut my mind off."  He reports rumination. He reports financial stress. He reports that his girlfriend had some bad news related to her health and is awaiting more test results. He reports that he has frequent negative memories and thoughts. Castastrophic thinking. He reports having occasional nightmares. He reports frequent awakenings during the night and some difficulty falling asleep. He reports increased depression and frustration- "I don't know what else to do." He reports motivation is typically very low. He reports frequent nausea. He reports that he has been eating less since her had COVID. Difficulty with concentration and focus. He reports, "I have no quality of life." Denies SI.   He reports that he started Lamictal this past week. He reports that he held off starting it until after medical tests are completed.   He reports that he cannot fall asleep unless he takes Ambien. He reports that he was dx'd with sleep apnea and will be picking up cPap next week.   He reports some family stressors.     Past Psychiatric Medication Trials: Buspar Cymbalta Lexapro- restless legs Effexor- restless legs Sertraline- excessive somnolence, Gi side  effects Gabapentin Lyrica-Adverse reactions Trazodone Ambien- excessive daytime somnolence Propranolol- ineffective Doxazosin Rexulti Xanax- Effective. Hypersomnolence Ambien  Review of Systems:  Review of Systems  Constitutional: Positive for fatigue.  Respiratory: Positive for chest tightness and shortness of breath.   Cardiovascular: Positive for palpitations.  Gastrointestinal: Positive for abdominal pain, nausea and vomiting.  Musculoskeletal: Negative for gait problem.  Skin: Negative for rash.  Neurological: Negative for tremors and headaches.  Psychiatric/Behavioral:       Please refer to HPI    Medications: I have reviewed the patient's current medications.  Current Outpatient Medications  Medication Sig Dispense Refill  . ALPRAZolam (XANAX) 0.5 MG tablet Take 1/2-1 tab po qd prn panic 15 tablet 1  . clonazePAM (KLONOPIN) 0.5 MG tablet Take 1 tablet (0.5 mg total) by mouth 2 (two) times daily as needed for anxiety. 60 tablet 1  . dexlansoprazole (DEXILANT) 60 MG capsule Take by mouth.    . hydrochlorothiazide (HYDRODIURIL) 25 MG tablet Take by mouth.    . losartan (COZAAR) 100 MG tablet Take by mouth.    . meclizine (ANTIVERT) 25 MG tablet Take 25 mg by mouth 3 (three) times daily as needed for dizziness.    Marland Kitchen zolpidem (AMBIEN) 10 MG tablet Take 10 mg by mouth at bedtime as needed.    . DULoxetine (CYMBALTA) 60 MG capsule Take 1 capsule (60 mg total) by mouth daily. 90 capsule 0  . lamoTRIgine (LAMICTAL) 100 MG tablet Take 1 tab po qd x 2 weeks, then increase to 1.5 tabs po qd 45 tablet 0  . lamoTRIgine (LAMICTAL) 25 MG tablet  Take 1 tablet (25 mg total) by mouth daily for 14 days, THEN 2 tablets (50 mg total) daily for 14 days. 45 tablet 0  . propranolol (INDERAL) 20 MG tablet Take 1 tablet (20 mg total) by mouth 2 (two) times daily. 180 tablet 0   No current facility-administered medications for this visit.    Medication Side Effects: None  Allergies:   Allergies  Allergen Reactions  . Percocet [Oxycodone-Acetaminophen]     Sick to stomach    Past Medical History:  Diagnosis Date  . Arthritis    back   . Chronic back pain    HNP and stenosis  . Depression   . GERD (gastroesophageal reflux disease)    takes Nexium daily  . Hyperlipidemia    wsa on meds but hasn't been on any in 2-3 months  . Hypertension    takes Benicare HCT daily  . Muscle spasm    takes Zanaflex nightly  . Sleep apnea    sleep study done 5-39yrs ago in [place] but doesn't have any idea where  . Umbilical hernia   . Weakness    numbness and tingling    Family History  Problem Relation Age of Onset  . Anxiety disorder Paternal Uncle   . Depression Paternal Uncle   . Post-traumatic stress disorder Father        PTSD after [place]. 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 Marital status: Single    Spouse name: Not on file  . Number of children: Not on file  . Years of education: Not on file  . Highest education level: Not on file  Occupational History  . Not on file  Tobacco Use  . Smoking status: Never Smoker  . Smokeless tobacco: Never Used  Substance and Sexual Activity  . Alcohol use: Yes    Comment: occasionally  . Drug use: No  . Sexual activity: Not on file  Other Topics Concern  . Not on file  Social History Narrative  . Not on file   Social Determinants of Health   Financial Resource Strain: Not on file  Food Insecurity: Not on file  Transportation Needs: Not on file  Physical Activity: Not on file  Stress: Not on file  Social Connections: Not on file  Intimate Partner Violence: Not on file    Past Medical History, Surgical history, Social history, and Family history were reviewed and updated as appropriate.   Please see review of systems for further details on the patient's review from today.   Objective:   Physical Exam:  BP 130/88   Pulse 61    Wt 258 lb (117 kg)   BMI 35.98 kg/m   Physical Exam Constitutional:      General: He is not in acute distress. Musculoskeletal:        General: No deformity.  Neurological:     Mental Status: He is alert and oriented to person, place, and time.     Coordination: Coordination normal.  Psychiatric:        Attention and Perception: Attention and perception normal. He does not perceive auditory or visual hallucinations.        Mood and Affect: Mood is anxious and depressed. Affect is not labile, blunt, angry or inappropriate.        Speech: Speech normal.        Behavior: Behavior is cooperative.  Thought Content: Thought content normal. Thought content is not paranoid or delusional. Thought content does not include homicidal or suicidal ideation. Thought content does not include homicidal or suicidal plan.        Cognition and Memory: Cognition and memory normal.        Judgment: Judgment normal.     Comments: Insight intact Restless     Lab Review:     Component Value Date/Time   NA 140 03/24/2014 1039   K 4.1 03/24/2014 1039   CL 103 03/24/2014 1039   CO2 22 03/24/2014 1039   GLUCOSE 104 (H) 03/24/2014 1039   BUN 15 03/24/2014 1039   CREATININE 0.84 03/24/2014 1039   CALCIUM 9.6 03/24/2014 1039   GFRNONAA >90 03/24/2014 1039   GFRAA >90 03/24/2014 1039       Component Value Date/Time   WBC 5.2 03/24/2014 1039   RBC 5.54 03/24/2014 1039   HGB 16.7 03/24/2014 1039   HCT 46.6 03/24/2014 1039   PLT 161 03/24/2014 1039   MCV 84.1 03/24/2014 1039   MCH 30.1 03/24/2014 1039   MCHC 35.8 03/24/2014 1039   RDW 12.8 03/24/2014 1039    No results found for: POCLITH, LITHIUM   No results found for: PHENYTOIN, PHENOBARB, VALPROATE, CBMZ   .res Assessment: Plan:   Discussed possible treatment options to improve acute anxiety to include potential benefits, risks, and side effects. Discussed potential benefits, risk, and side effects of benzodiazepines to include  potential risk of tolerance and dependence, as well as possible drowsiness.  Advised patient not to drive if experiencing drowsiness and to take lowest possible effective dose to minimize risk of dependence and tolerance. Will start Klonopin 0.5 mg po BID for anxiety.  Will change Propranolol to 20 mg tabs since pt reports that taking Propranolol 10 mg two tabs po BID has been helpful for blood pressure and anxiety.  Recommend continuing to titrate lamotrigine as directed and discussed contacting office if he experiences a rash. Continue Cymbalta 60 mg po qd for mood, anxiety, and pain. Patient to follow-up in 6 weeks or sooner if clinically indicated. Patient advised to contact office with any questions, adverse effects, or acute worsening in signs and symptoms.  Corey Chen was seen today for anxiety.  Diagnoses and all orders for this visit:  PTSD (post-traumatic stress disorder) -     clonazePAM (KLONOPIN) 0.5 MG tablet; Take 1 tablet (0.5 mg total) by mouth 2 (two) times daily as needed for anxiety. -     propranolol (INDERAL) 20 MG tablet; Take 1 tablet (20 mg total) by mouth 2 (two) times daily. -     DULoxetine (CYMBALTA) 60 MG capsule; Take 1 capsule (60 mg total) by mouth daily.  Severe episode of recurrent major depressive disorder, without psychotic features (HCC) -     DULoxetine (CYMBALTA) 60 MG capsule; Take 1 capsule (60 mg total) by mouth daily.  Insomnia due to mental disorder     Please see After Visit Summary for patient specific instructions.  Future Appointments  Date Time Provider Department Center  11/30/2020  9:30 AM Corie Chiquito, PMHNP [facility] None    No orders of the defined types were placed in this encounter.   -------------------------------

## 2020-11-02 ENCOUNTER — Ambulatory Visit: Payer: BLUE CROSS/BLUE SHIELD | Admitting: Psychiatry

## 2020-11-30 ENCOUNTER — Ambulatory Visit: Payer: Self-pay | Admitting: Psychiatry

## 2020-12-06 ENCOUNTER — Other Ambulatory Visit: Payer: Self-pay | Admitting: Psychiatry

## 2020-12-06 DIAGNOSIS — F332 Major depressive disorder, recurrent severe without psychotic features: Secondary | ICD-10-CM

## 2020-12-08 NOTE — Telephone Encounter (Signed)
Please review the note does not mention Lamictal and there are 2 different doses on med list

## 2020-12-08 NOTE — Telephone Encounter (Signed)
He was going to start Lamictal at last visit at the 25 mg dose for 2 weeks, then the 50 mg for 2 weeks. There was a script on file for the 100 mg tabs once he finished script for 25 mg. Please call him to find out what dose he is taking and if he has had any sign of rash.

## 2020-12-20 ENCOUNTER — Other Ambulatory Visit: Payer: Self-pay | Admitting: Psychiatry

## 2020-12-20 DIAGNOSIS — F4310 Post-traumatic stress disorder, unspecified: Secondary | ICD-10-CM

## 2020-12-26 NOTE — Telephone Encounter (Signed)
FYI I have been trying to reach this pt for 2 weeks and left several messages

## 2021-01-20 ENCOUNTER — Other Ambulatory Visit: Payer: Self-pay | Admitting: Psychiatry

## 2021-01-20 DIAGNOSIS — F4310 Post-traumatic stress disorder, unspecified: Secondary | ICD-10-CM

## 2021-01-24 NOTE — Telephone Encounter (Signed)
Please schedule for f/u with Shanda Bumps

## 2021-01-24 NOTE — Telephone Encounter (Signed)
Pt has an appt 8/12

## 2021-02-24 ENCOUNTER — Other Ambulatory Visit: Payer: Self-pay | Admitting: Psychiatry

## 2021-02-24 DIAGNOSIS — F4310 Post-traumatic stress disorder, unspecified: Secondary | ICD-10-CM

## 2021-03-02 ENCOUNTER — Encounter: Payer: Self-pay | Admitting: Psychiatry

## 2021-03-02 ENCOUNTER — Other Ambulatory Visit: Payer: Self-pay

## 2021-03-02 ENCOUNTER — Ambulatory Visit: Payer: BLUE CROSS/BLUE SHIELD | Admitting: Psychiatry

## 2021-03-02 DIAGNOSIS — F332 Major depressive disorder, recurrent severe without psychotic features: Secondary | ICD-10-CM

## 2021-03-02 DIAGNOSIS — F4310 Post-traumatic stress disorder, unspecified: Secondary | ICD-10-CM | POA: Diagnosis not present

## 2021-03-02 MED ORDER — CLONAZEPAM 0.5 MG PO TABS: 0.5000 mg | ORAL_TABLET | Freq: Two times a day (BID) | ORAL | 1 refills | Status: AC | PRN

## 2021-03-02 MED ORDER — DULOXETINE HCL 60 MG PO CPEP: 60.0000 mg | ORAL_CAPSULE | Freq: Every day | ORAL | 1 refills | Status: AC

## 2021-03-02 MED ORDER — PROPRANOLOL HCL 20 MG PO TABS: 20.0000 mg | ORAL_TABLET | Freq: Two times a day (BID) | ORAL | 1 refills | Status: DC

## 2021-03-02 MED ORDER — QUETIAPINE FUMARATE 50 MG PO TABS
50.0000 mg | ORAL_TABLET | Freq: Every day | ORAL | 2 refills | Status: AC
Start: 2021-03-02 — End: ?

## 2021-03-02 NOTE — Progress Notes (Signed)
Corey Chen [id number] [birth year] 50 y.o.  Subjective:   Patient ID:  Corey Chen is a 50 y.o. (DOB 07/11/1971) male.  Chief Complaint:  Chief Complaint  Patient presents with   Anxiety   Depression   Insomnia    HPI Corey Chen presents to the office today for follow-up of anxiety, depression, and insomnia. He reports "no desire to do anything" and describes low energy, low motivation, and diminished interest in things. He reports that he will put things off and "don't want to deal with anything." He reports that his mood is persistently sad. Youngest daughter just turned 74 and moved out 2 weeks ago. He reports that this has been an adjustment since he has had more time alone and this is not helpful for his mood. He reports poor concentration and memory. He reports that family and friends will frequently have to remind him to do things. He reports that sometimes he is saying something different than what he is thinking.   He reports that he is uncomfortable in groups, even with people that he knows well and likes. He avoids crowds and large events. He reports that he did not go to a family birthday and visited family member the day before instead. He reports that he has panic attacks periodically and that Klonopin prn has been helpful. He reports that he has intrusive memories and flashbacks about father's death and other family members. Reports that he continues to see brother's face when he died at 44 yo.  Reports frequent worry and anxious thoughts. He reports that physical s/s of anxiety "comes in spurts" with occasional GI s/s. He reports that chest tightness has resolved with cPap. He reports that he has to constantly clear his throat and multiple medical providers have attributes this to anxiety after medical work-up.    He reports that PCP prescribed Ambien and this was not effective for him. He reports that he was then prescribed Trazodone. He reports that Trazodone was effective  for one night and then no longer was effective. He reports difficulty falling asleep. He reports that he will feel tired and go to bed 9-10 pm and sometimes be unable to fall asleep until 2-3 am. Occasional nightmares. He now has a cPap machine and reports that this has been helpful and daytime somnolence has improved. He reports that his appetite has been increased and reports feeling like he "cannot stop" eating. Denies SI.   He reports that he did not see an improvement with Lamictal and felt "weak."   Had disability hearing and is awaiting determination.   Family stressors with mother and brother.   Past Psychiatric Medication Trials: Buspar Cymbalta Lexapro- restless legs Effexor- restless legs Sertraline- excessive somnolence, Gi side effects Gabapentin Lyrica-Adverse reactions Trazodone Ambien- excessive daytime somnolence Propranolol- ineffective Doxazosin Rexulti Lamictal- Ineffective, weak Xanax- Effective. Hypersomnolence Klonopin Ambien      Review of Systems:  Review of Systems  HENT:         Frequent throat clearing  Musculoskeletal:  Positive for back pain. Negative for gait problem.  Psychiatric/Behavioral:         Please refer to HPI   Medications: I have reviewed the patient's current medications.  Current Outpatient Medications  Medication Sig Dispense Refill   dexlansoprazole (DEXILANT) 60 MG capsule Take by mouth.     hydrochlorothiazide (HYDRODIURIL) 25 MG tablet Take by mouth.     losartan (COZAAR) 100 MG tablet Take by mouth.     meclizine (ANTIVERT) 25 MG  tablet Take 25 mg by mouth 3 (three) times daily as needed for dizziness.     meloxicam (MOBIC) 15 MG tablet Take 15 mg by mouth daily.     QUEtiapine (SEROQUEL) 50 MG tablet Take 1 tablet (50 mg total) by mouth at bedtime. 30 tablet 2   clonazePAM (KLONOPIN) 0.5 MG tablet Take 1 tablet (0.5 mg total) by mouth 2 (two) times daily as needed for anxiety. 60 tablet 1   DULoxetine (CYMBALTA) 60 MG  capsule Take 1 capsule (60 mg total) by mouth daily. 90 capsule 1   propranolol (INDERAL) 20 MG tablet Take 1 tablet (20 mg total) by mouth 2 (two) times daily. 180 tablet 1   No current facility-administered medications for this visit.    Medication Side Effects: None  Allergies:  Allergies  Allergen Reactions   Percocet [Oxycodone-Acetaminophen]     Sick to stomach    Past Medical History:  Diagnosis Date   Arthritis    back    Chronic back pain    HNP and stenosis   Depression    Fatty liver    GERD (gastroesophageal reflux disease)    takes Nexium daily   Hyperlipidemia    wsa on meds but hasn't been on any in 2-3 months   Hypertension    takes Benicare HCT daily   Muscle spasm    takes Zanaflex nightly   Sleep apnea    sleep study done 5-64yrs ago in [place] but doesn't have any idea where   Umbilical hernia    Weakness    numbness and tingling    Past Medical History, Surgical history, Social history, and Family history were reviewed and updated as appropriate.   Please see review of systems for further details on the patient's review from today.   Objective:   Physical Exam:  BP (!) 149/101 Comment: Has not taken BP meds yet  Pulse 64   Physical Exam Constitutional:      General: He is not in acute distress. Musculoskeletal:        General: No deformity.  Neurological:     Mental Status: He is alert and oriented to person, place, and time.     Coordination: Coordination normal.  Psychiatric:        Attention and Perception: Attention and perception normal. He does not perceive auditory or visual hallucinations.        Mood and Affect: Mood is anxious and depressed. Affect is tearful. Affect is not labile, blunt, angry or inappropriate.        Speech: Speech normal.        Behavior: Behavior normal.        Thought Content: Thought content normal. Thought content is not paranoid or delusional. Thought content does not include homicidal or suicidal  ideation. Thought content does not include homicidal or suicidal plan.        Cognition and Memory: Cognition and memory normal.        Judgment: Judgment normal.     Comments: Insight intact    Lab Review:     Component Value Date/Time   NA 140 03/24/2014 1039   K 4.1 03/24/2014 1039   CL 103 03/24/2014 1039   CO2 22 03/24/2014 1039   GLUCOSE 104 (H) 03/24/2014 1039   BUN 15 03/24/2014 1039   CREATININE 0.84 03/24/2014 1039   CALCIUM 9.6 03/24/2014 1039   GFRNONAA >90 03/24/2014 1039   GFRAA >90 03/24/2014 1039  Component Value Date/Time   WBC 5.2 03/24/2014 1039   RBC 5.54 03/24/2014 1039   HGB 16.7 03/24/2014 1039   HCT 46.6 03/24/2014 1039   PLT 161 03/24/2014 1039   MCV 84.1 03/24/2014 1039   MCH 30.1 03/24/2014 1039   MCHC 35.8 03/24/2014 1039   RDW 12.8 03/24/2014 1039    No results found for: POCLITH, LITHIUM   No results found for: PHENYTOIN, PHENOBARB, VALPROATE, CBMZ   .res Assessment: Plan:   Discussed potential metabolic side effects associated with atypical antipsychotics, as well as potential risk for movement side effects. Advised pt to contact office if movement side effects occur. Discussed potential benefits, risks, and side effects of Seroquel. Discussed that treatment of insomnia and anxiety with Seroquel are off-label indications. He agrees to trial of Seroquel.  Will start Seroquel 50 mg po QHS for insomnia and anxiety.  Continue Duloxetine 60 mg daily for anxiety and depression.  Continue Propranolol 20 mg po BID for anxiety and elevated BP.  Continue Klonopin 0.5 mg po BID prn anxiety.  Advised pt to re-check BP after taking BP meds and to follow-up with PCP immediately if BP does not improve.  Pt to follow-up in 2 months or sooner if clinically indicated.  Patient advised to contact office with any questions, adverse effects, or acute worsening in signs and symptoms.  Corey Chen was seen today for anxiety, depression and  insomnia.  Diagnoses and all orders for this visit:  PTSD (post-traumatic stress disorder) -     QUEtiapine (SEROQUEL) 50 MG tablet; Take 1 tablet (50 mg total) by mouth at bedtime. -     clonazePAM (KLONOPIN) 0.5 MG tablet; Take 1 tablet (0.5 mg total) by mouth 2 (two) times daily as needed for anxiety. -     DULoxetine (CYMBALTA) 60 MG capsule; Take 1 capsule (60 mg total) by mouth daily. -     propranolol (INDERAL) 20 MG tablet; Take 1 tablet (20 mg total) by mouth 2 (two) times daily.  Severe episode of recurrent major depressive disorder, without psychotic features (HCC) -     DULoxetine (CYMBALTA) 60 MG capsule; Take 1 capsule (60 mg total) by mouth daily.    Please see After Visit Summary for patient specific instructions.  No future appointments.   No orders of the defined types were placed in this encounter.   -------------------------------

## 2021-03-26 ENCOUNTER — Other Ambulatory Visit: Payer: Self-pay | Admitting: Psychiatry

## 2021-03-26 DIAGNOSIS — F4310 Post-traumatic stress disorder, unspecified: Secondary | ICD-10-CM

## 2021-04-27 ENCOUNTER — Other Ambulatory Visit: Payer: Self-pay | Admitting: Physician Assistant

## 2021-04-27 DIAGNOSIS — F4310 Post-traumatic stress disorder, unspecified: Secondary | ICD-10-CM

## 2021-05-02 ENCOUNTER — Ambulatory Visit: Payer: BLUE CROSS/BLUE SHIELD | Admitting: Psychiatry

## 2021-05-29 ENCOUNTER — Other Ambulatory Visit: Payer: Self-pay | Admitting: Physician Assistant

## 2021-05-29 DIAGNOSIS — F4310 Post-traumatic stress disorder, unspecified: Secondary | ICD-10-CM

## 2021-06-24 ENCOUNTER — Other Ambulatory Visit: Payer: Self-pay | Admitting: Physician Assistant

## 2021-06-24 DIAGNOSIS — F4310 Post-traumatic stress disorder, unspecified: Secondary | ICD-10-CM

## 2021-06-25 NOTE — Telephone Encounter (Signed)
Schedule apt with Shanda Bumps for follow up

## 2021-06-26 NOTE — Telephone Encounter (Signed)
Left message for pt to schedule

## 2021-07-09 ENCOUNTER — Other Ambulatory Visit: Payer: Self-pay | Admitting: Psychiatry

## 2021-07-09 DIAGNOSIS — F4310 Post-traumatic stress disorder, unspecified: Secondary | ICD-10-CM

## 2021-07-11 NOTE — Telephone Encounter (Signed)
Pt has an appt 1/30

## 2021-08-20 ENCOUNTER — Ambulatory Visit: Payer: BLUE CROSS/BLUE SHIELD | Admitting: Psychiatry

## 2023-06-04 ENCOUNTER — Encounter: Payer: Self-pay | Admitting: Psychiatry
